# Patient Record
Sex: Female | Born: 1945 | Race: White | Hispanic: No | Marital: Married | Smoking: Current every day smoker
Health system: Southern US, Community
[De-identification: ages and names within clinical notes are randomized; demographics above are authoritative.]

## PROBLEM LIST (undated history)

## (undated) DIAGNOSIS — F419 Anxiety disorder, unspecified: Secondary | ICD-10-CM

## (undated) DIAGNOSIS — G2581 Restless legs syndrome: Secondary | ICD-10-CM

## (undated) DIAGNOSIS — R0600 Dyspnea, unspecified: Secondary | ICD-10-CM

## (undated) DIAGNOSIS — M1990 Unspecified osteoarthritis, unspecified site: Secondary | ICD-10-CM

## (undated) DIAGNOSIS — I10 Essential (primary) hypertension: Secondary | ICD-10-CM

## (undated) DIAGNOSIS — E119 Type 2 diabetes mellitus without complications: Secondary | ICD-10-CM

## (undated) DIAGNOSIS — E039 Hypothyroidism, unspecified: Secondary | ICD-10-CM

## (undated) DIAGNOSIS — J449 Chronic obstructive pulmonary disease, unspecified: Secondary | ICD-10-CM

## (undated) HISTORY — PX: BACK SURGERY: SHX140

## (undated) HISTORY — PX: HERNIA REPAIR: SHX51

## (undated) HISTORY — PX: ABDOMINAL HYSTERECTOMY: SHX81

## (undated) HISTORY — PX: CARPAL TUNNEL RELEASE: SHX101

---

## 2005-06-09 ENCOUNTER — Ambulatory Visit (HOSPITAL_COMMUNITY): Admission: RE | Admit: 2005-06-09 | Discharge: 2005-06-10 | Payer: Self-pay | Admitting: Neurological Surgery

## 2018-07-28 ENCOUNTER — Other Ambulatory Visit: Payer: Self-pay | Admitting: Neurological Surgery

## 2018-07-28 DIAGNOSIS — M4316 Spondylolisthesis, lumbar region: Secondary | ICD-10-CM

## 2018-08-10 ENCOUNTER — Ambulatory Visit
Admission: RE | Admit: 2018-08-10 | Discharge: 2018-08-10 | Disposition: A | Discharge status: Home / Self Care | Payer: Medicare HMO | Source: Ambulatory Visit | Attending: Neurological Surgery | Admitting: Neurological Surgery

## 2018-08-10 DIAGNOSIS — M4316 Spondylolisthesis, lumbar region: Secondary | ICD-10-CM

## 2018-08-10 MED ORDER — GADOBENATE DIMEGLUMINE 529 MG/ML IV SOLN
10.0000 mL | Freq: Once | INTRAVENOUS | Status: AC | PRN
  Administered 2018-08-10: 10 mL via INTRAVENOUS

## 2018-08-24 ENCOUNTER — Other Ambulatory Visit: Payer: Self-pay | Admitting: Student

## 2018-08-24 DIAGNOSIS — M4316 Spondylolisthesis, lumbar region: Secondary | ICD-10-CM

## 2018-09-02 ENCOUNTER — Ambulatory Visit
Admission: RE | Admit: 2018-09-02 | Discharge: 2018-09-02 | Disposition: A | Discharge status: Home / Self Care | Payer: Medicare HMO | Source: Ambulatory Visit | Attending: Student | Admitting: Student

## 2018-09-02 DIAGNOSIS — M4316 Spondylolisthesis, lumbar region: Secondary | ICD-10-CM

## 2018-09-02 MED ORDER — METHYLPREDNISOLONE ACETATE 40 MG/ML INJ SUSP (RADIOLOG
120.0000 mg | Freq: Once | INTRAMUSCULAR | Status: AC
  Administered 2018-09-02: 120 mg via EPIDURAL

## 2018-09-02 MED ORDER — IOPAMIDOL (ISOVUE-M 200) INJECTION 41%
1.0000 mL | Freq: Once | INTRAMUSCULAR | Status: AC
  Administered 2018-09-02: 1 mL via EPIDURAL

## 2018-09-02 NOTE — Discharge Instructions (Signed)

## 2018-09-20 ENCOUNTER — Other Ambulatory Visit: Payer: Self-pay | Admitting: Neurological Surgery

## 2018-11-23 ENCOUNTER — Other Ambulatory Visit (HOSPITAL_COMMUNITY): Payer: Medicare HMO

## 2018-11-24 ENCOUNTER — Ambulatory Visit (HOSPITAL_COMMUNITY)
Admission: RE | Admit: 2018-11-24 | Discharge: 2018-11-24 | Disposition: A | Discharge status: Home / Self Care | Payer: Medicare HMO | Source: Ambulatory Visit | Attending: Neurological Surgery | Admitting: Neurological Surgery

## 2018-11-24 ENCOUNTER — Encounter (HOSPITAL_COMMUNITY)
Admission: RE | Admit: 2018-11-24 | Discharge: 2018-11-24 | Disposition: A | Discharge status: Home / Self Care | Payer: Medicare HMO | Source: Ambulatory Visit | Attending: Neurological Surgery | Admitting: Neurological Surgery

## 2018-11-24 ENCOUNTER — Encounter (HOSPITAL_COMMUNITY): Payer: Self-pay

## 2018-11-24 DIAGNOSIS — M4310 Spondylolisthesis, site unspecified: Secondary | ICD-10-CM

## 2018-11-24 HISTORY — DX: Type 2 diabetes mellitus without complications: E11.9

## 2018-11-24 HISTORY — DX: Anxiety disorder, unspecified: F41.9

## 2018-11-24 HISTORY — DX: Hypothyroidism, unspecified: E03.9

## 2018-11-24 HISTORY — DX: Restless legs syndrome: G25.81

## 2018-11-24 HISTORY — DX: Unspecified osteoarthritis, unspecified site: M19.90

## 2018-11-24 HISTORY — DX: Chronic obstructive pulmonary disease, unspecified: J44.9

## 2018-11-24 HISTORY — DX: Essential (primary) hypertension: I10

## 2018-11-24 HISTORY — DX: Dyspnea, unspecified: R06.00

## 2018-11-24 LAB — TYPE AND SCREEN
ABO/RH(D): O NEG
Antibody Screen: NEGATIVE

## 2018-11-24 LAB — CBC WITH DIFFERENTIAL/PLATELET
Abs Immature Granulocytes: 0.06 10*3/uL (ref 0.00–0.07)
BASOS ABS: 0.1 10*3/uL (ref 0.0–0.1)
Basophils Relative: 1 %
Eosinophils Absolute: 0.2 10*3/uL (ref 0.0–0.5)
Eosinophils Relative: 2 %
HEMATOCRIT: 46.3 % — AB (ref 36.0–46.0)
Hemoglobin: 14.8 g/dL (ref 12.0–15.0)
Immature Granulocytes: 1 %
Lymphocytes Relative: 26 %
Lymphs Abs: 3 10*3/uL (ref 0.7–4.0)
MCH: 30.1 pg (ref 26.0–34.0)
MCHC: 32 g/dL (ref 30.0–36.0)
MCV: 94.3 fL (ref 80.0–100.0)
Monocytes Absolute: 1.1 10*3/uL — ABNORMAL HIGH (ref 0.1–1.0)
Monocytes Relative: 9 %
Neutro Abs: 7.2 10*3/uL (ref 1.7–7.7)
Neutrophils Relative %: 61 %
Platelets: 261 10*3/uL (ref 150–400)
RBC: 4.91 MIL/uL (ref 3.87–5.11)
RDW: 15.1 % (ref 11.5–15.5)
WBC: 11.6 10*3/uL — ABNORMAL HIGH (ref 4.0–10.5)
nRBC: 0 % (ref 0.0–0.2)

## 2018-11-24 LAB — BASIC METABOLIC PANEL
Anion gap: 12 (ref 5–15)
BUN: 22 mg/dL (ref 8–23)
CO2: 23 mmol/L (ref 22–32)
Calcium: 9.4 mg/dL (ref 8.9–10.3)
Chloride: 105 mmol/L (ref 98–111)
Creatinine, Ser: 1.28 mg/dL — ABNORMAL HIGH (ref 0.44–1.00)
GFR calc Af Amer: 49 mL/min — ABNORMAL LOW (ref >60)
GFR calc non Af Amer: 42 mL/min — ABNORMAL LOW (ref >60)
Glucose, Bld: 67 mg/dL — ABNORMAL LOW (ref 70–99)
Potassium: 4 mmol/L (ref 3.5–5.1)
Sodium: 140 mmol/L (ref 135–145)

## 2018-11-24 LAB — PROTIME-INR
INR: 0.93
Prothrombin Time: 12.3 seconds (ref 11.4–15.2)

## 2018-11-24 LAB — HEMOGLOBIN A1C
Hgb A1c MFr Bld: 7.2 % — ABNORMAL HIGH (ref 4.8–5.6)
Mean Plasma Glucose: 159.94 mg/dL

## 2018-11-24 LAB — GLUCOSE, CAPILLARY: Glucose-Capillary: 46 mg/dL — ABNORMAL LOW (ref 70–99)

## 2018-11-24 LAB — SURGICAL PCR SCREEN
MRSA, PCR: NEGATIVE
Staphylococcus aureus: NEGATIVE

## 2018-11-24 LAB — ABO/RH: ABO/RH(D): O NEG

## 2018-11-24 MED ORDER — CHLORHEXIDINE GLUCONATE CLOTH 2 % EX PADS: 6.0000 | MEDICATED_PAD | Freq: Once | CUTANEOUS | Status: DC

## 2018-11-24 NOTE — Progress Notes (Addendum)
Pt cbg was 46 at pre admission, discussed importance of controlled sugar.Pt was given oj and cookies. Pt stated she would call PCP to discuss cbg and clearance. Will f/u with Revonda Standard pa

## 2018-11-24 NOTE — Pre-Procedure Instructions (Signed)
Jill Friedman  11/24/2018      [facility] - [place], [place] - [address] [place] [address] Phone: [phone] Fax: [phone]    Your procedure is scheduled on 11/22/18.  Report to [facility] Admitting at 50 A.M.  Call this number if you have problems the morning of surgery:  [phone]   Remember:  Do not eat or drink after midnight.  Y Take these medicines the morning of surgery with A SIP OF WATER ---    Do not wear jewelry, make-up or nail polish.  Do not wear lotions, powders, or perfumes, or deodorant.  Do not shave 48 hours prior to surgery.  Men may shave face and neck.  Do not bring valuables to the hospital.  [facility] is not responsible for any belongings or valuables.  Contacts, dentures or bridgework may not be worn into surgery.  Leave your suitcase in the car.  After surgery it may be brought to your room.  For patients admitted to the hospital, discharge time will be determined by your treatment team.  Patients discharged the day of surgery will not be allowed to drive home.   Name and phone number of your driver:   Do not take any aspirin,anti-inflammatories,vitamins,or herbal supplements 5-7 days prior to surgery. Special instructions:   - Preparing for Surgery  Before surgery, you can play an important role.  Because skin is not sterile, your skin needs to be as free of germs as possible.  You can reduce the number of germs on you skin by washing with CHG (chlorahexidine gluconate) soap before surgery.  CHG is an antiseptic cleaner which kills germs and bonds with the skin to continue killing germs even after washing.  Oral Hygiene is also important in reducing the risk of infection.  Remember to brush your teeth with your regular toothpaste the morning of surgery.  Please DO NOT use if you have an allergy to CHG or antibacterial soaps.  If your skin becomes reddened/irritated stop using  the CHG and inform your nurse when you arrive at Short Stay.  Do not shave (including legs and underarms) for at least 48 hours prior to the first CHG shower.  You may shave your face.  Please follow these instructions carefully:   1.  Shower with CHG Soap the night before surgery and the morning of Surgery.  2.  If you choose to wash your hair, wash your hair first as usual with your normal shampoo.  3.  After you shampoo, rinse your hair and body thoroughly to remove the shampoo. 4.  Use CHG as you would any other liquid soap.  You can apply chg directly to the skin and wash gently with a      scrungie or washcloth.           5.  Apply the CHG Soap to your body ONLY FROM THE NECK DOWN.   Do not use on open wounds or open sores. Avoid contact with your eyes, ears, mouth and genitals (private parts).  Wash genitals (private parts) with your normal soap.  6.  Wash thoroughly, paying special attention to the area where your surgery will be performed.  7.  Thoroughly rinse your body with warm water from the neck down.  8.  DO NOT shower/wash with your normal soap after using and rinsing off the CHG Soap.  9.  Pat yourself dry with a clean  towel.            10.  Wear clean pajamas.            11.  Place clean sheets on your bed the night of your first shower and do not sleep with pets.  Day of Surgery  Do not apply any lotions/deoderants the morning of surgery.   Please wear clean clothes to the hospital/surgery center. Remember to brush your teeth with toothpaste.    Please read over the following fact sheets that you were given. MRSA Information

## 2018-11-25 ENCOUNTER — Encounter (HOSPITAL_COMMUNITY): Payer: Self-pay

## 2018-11-25 NOTE — Progress Notes (Addendum)
Anesthesia Chart Review:  Case:  [id number] Date/Time:  12/02/18 0715   Procedure:  TLIF - right - L4-L5 - Interbody Fusion (Right Back)   Anesthesia type:  General   Pre-op diagnosis:  Spondylolisthesis   Location:  [facility] OR ROOM 19 / [facility] OR   Surgeon:  [name], MD      DISCUSSION: Patient is a 73 year old Jill Friedman scheduled for the above procedure.  History includes smoking, HTN, DM2, COPD, hypothyroidism, dyspnea, inflammatory arthritis.  - May 2019 EKG showed sinus arrhythmia, PACs. Done for evaluation of "irregular" heart rhythm by [facility] exam. I called and spoke with patient on 11/25/18. She denied chest pain, SOB, edema, orthopnea, palpitations. She said she can clean her house, although husband typically vacuums, but overall activity is more limited by her back and not by breathing or any cardiac symptoms. Patient said a few years ago a provider thought she may have a murmur, but no testing was ordered and since then has not been told she had a murmur. (No murmur documented at her most recent primary care and rheumatology visits.) She denied history of prior cardiac testing other than EKGs. Her mother died in her 51's for CHF (possible related to HTN). No family history of sudden cardiac death.   - She was hypoglycemic at PAT--reports she had not eaten as much given her appointment. She checks her fasting CBG daily with range of 97-143 over the past several days.  - She does smoke 1PP. Treated out-patient for bronchitis earlier in 2019. No home O2. Has nebulizer PRN, but without frequent need.  Awaiting EKG tracings from Dr. Adela Ports office but based on their report narrative and above known information, it is anticipated that she can proceed as planned if no acute changes. Chart also discussed with anesthesiologist Karna Christmas, MD. (UPDATE 11/20/18 10:40 AM: 04/15/18 EKG tracing x2 received from Dr. Adela Ports office. Report summary as already outlined showing right BBB. There were no  significant T wave changes. No new recommendations since my previous discussion with Dr. Bradley Ferris.)   VS: BP (!) 143/88   Pulse 89   Temp 37.1 C   Ht 5\' 2"  (1.575 m)   Wt 54.5 kg   SpO2 94%   BMI 21.98 kg/m    PROVIDERS: Tarri Fuller, MD is PCP [facility] Everywhere) Francee Gentile, MD is rheumatologist [facility] Everywhere).   LABS: Preoperative labs noted. Cr 1.28. Glucose 67 (following OJ, cooke for CBG 46). A1c 7.2.  (all labs ordered are listed, but only abnormal results are displayed)  Labs Reviewed  GLUCOSE, CAPILLARY - Abnormal; Notable for the following components:      Result Value   Glucose-Capillary 46 (*)    All other components within normal limits  BASIC METABOLIC PANEL - Abnormal; Notable for the following components:   Glucose, Bld 67 (*)    Creatinine, Ser 1.28 (*)    GFR calc non Af Amer 42 (*)    GFR calc Af Amer 49 (*)    All other components within normal limits  CBC WITH DIFFERENTIAL/PLATELET - Abnormal; Notable for the following components:   WBC 11.6 (*)    HCT 46.3 (*)    Monocytes Absolute 1.1 (*)    All other components within normal limits  HEMOGLOBIN A1C - Abnormal; Notable for the following components:   Hgb A1c MFr Bld 7.2 (*)    All other components within normal limits  SURGICAL PCR SCREEN  PROTIME-INR  TYPE AND SCREEN  ABO/RH    IMAGES: CXR 11/24/18: IMPRESSION: No active cardiopulmonary disease.   EKG: Patient seen by Anice Paganinipcp Escajeda, Richard, MD on 04/15/18 [facility] was irregular on exam. Patient denied chest pain, palpitation, dizziness, dyspnea, fatigue. HR was 51 bpm, BP 140/70. EKG showed underlying SR with PACs and sinus arrhythmia. Verapamil was decreased due to bradycardia. TSH WNL. K was 5.6. Routine follow-up recommended. - 04/15/18 (PCP office): SR with marked sinus arrhythmia. Right BBB. When compared with ECG of 15-Apr-2018 10:37, Premature atrial complexes are no longer present Confirmed by  [facility], DR. F.R. ([id number]) on 04/15/2018 2:27:36 PM   - 04/15/18 (PCP office): Sinus bradycardia with Premature atrial complexes. Right bundle branch block. No previous ECGs available.Confirmed by fellow Hewitt ShortsBerdy, Andrew ([id number]) on 04/15/2018 1:11:18 PM Confirmed by Claris GowerKAHL, DR. F.R. ([id number]) on 04/15/2018 2:27:30 PM     CV: N/A  Past Medical History:  Diagnosis Date  . Anxiety   . Arthritis   . COPD (chronic obstructive pulmonary disease) (HCC)   . Diabetes mellitus without complication (HCC)   . Dyspnea   . Hypertension   . Hypothyroidism   . RLS (restless legs syndrome)     Past Surgical History:  Procedure Laterality Date  . ABDOMINAL HYSTERECTOMY    . BACK SURGERY    . CARPAL TUNNEL RELEASE    . HERNIA REPAIR      MEDICATIONS: . Cholecalciferol (VITAMIN D-1000 MAX ST) 25 MCG (1000 UT) tablet  . folic acid (FOLVITE) 1 MG tablet  . gabapentin (NEURONTIN) 600 MG tablet  . glimepiride (AMARYL) 4 MG tablet  . levothyroxine (SYNTHROID, LEVOTHROID) 75 MCG tablet  . lisinopril (PRINIVIL,ZESTRIL) 5 MG tablet  . metFORMIN (GLUCOPHAGE) 1000 MG tablet  . Multiple Vitamins-Minerals (CENTRUM SILVER 50+WOMEN) TABS  . verapamil (CALAN) 80 MG tablet  . vitamin B-12 (CYANOCOBALAMIN) 1000 MCG tablet   No current facility-administered medications for this encounter.     Shonna ChockAllison Zofia Peckinpaugh, PA-C Surgical Short Stay/Anesthesiology [facility] Phone [phone] [facility] Phone [phone] 11/26/2018 1:24 PM

## 2018-11-26 NOTE — Anesthesia Preprocedure Evaluation (Addendum)
Anesthesia Evaluation  Patient identified by MRN, date of birth, ID band Patient awake    Reviewed: Allergy & Precautions, NPO status , Patient's Chart, lab work & pertinent test results  Airway Mallampati: II  TM Distance: >3 FB Neck ROM: Full    Dental  (+) Dental Advisory Given, Edentulous Upper   Pulmonary COPD,  COPD inhaler, Current Smoker,    breath sounds clear to auscultation       Cardiovascular hypertension, Pt. on medications  Rhythm:Regular Rate:Normal     Neuro/Psych    GI/Hepatic   Endo/Other  diabetes, Type 2  Renal/GU      Musculoskeletal   Abdominal   Peds  Hematology   Anesthesia Other Findings   Reproductive/Obstetrics                           Anesthesia Physical Anesthesia Plan  ASA: III  Anesthesia Plan: General   Post-op Pain Management:    Induction: Intravenous  PONV Risk Score and Plan: Ondansetron  Airway Management Planned: Oral ETT  Additional Equipment:   Intra-op Plan:   Post-operative Plan: Extubation in OR  Informed Consent: I have reviewed the patients History and Physical, chart, labs and discussed the procedure including the risks, benefits and alternatives for the proposed anesthesia with the patient or authorized representative who has indicated his/her understanding and acceptance.     Dental advisory given  Plan Discussed with: CRNA and Anesthesiologist  Anesthesia Plan Comments: (PAT note written 11/26/2018 by Shonna Chock, PA-C. )       Anesthesia Quick Evaluation

## 2018-12-02 ENCOUNTER — Encounter (HOSPITAL_COMMUNITY): Payer: Self-pay | Admitting: *Deleted

## 2018-12-02 ENCOUNTER — Inpatient Hospital Stay (HOSPITAL_COMMUNITY): Payer: Medicare HMO | Admitting: Vascular Surgery

## 2018-12-02 ENCOUNTER — Encounter (HOSPITAL_COMMUNITY)
Admission: RE | Disposition: A | Discharge status: Home / Self Care | Payer: Self-pay | Source: Home / Self Care | Attending: Neurological Surgery

## 2018-12-02 ENCOUNTER — Inpatient Hospital Stay (HOSPITAL_COMMUNITY): Payer: Medicare HMO | Admitting: Anesthesiology

## 2018-12-02 ENCOUNTER — Inpatient Hospital Stay (HOSPITAL_COMMUNITY)
Admission: RE | Admit: 2018-12-02 | Discharge: 2018-12-03 | DRG: 455 | Disposition: A | Discharge status: Home / Self Care | Payer: Medicare HMO | Attending: Neurological Surgery | Admitting: Neurological Surgery

## 2018-12-02 DIAGNOSIS — Z79899 Other long term (current) drug therapy: Secondary | ICD-10-CM | POA: Diagnosis not present

## 2018-12-02 DIAGNOSIS — Z90710 Acquired absence of both cervix and uterus: Secondary | ICD-10-CM

## 2018-12-02 DIAGNOSIS — Z79890 Hormone replacement therapy (postmenopausal): Secondary | ICD-10-CM

## 2018-12-02 DIAGNOSIS — I10 Essential (primary) hypertension: Secondary | ICD-10-CM | POA: Diagnosis present

## 2018-12-02 DIAGNOSIS — M48061 Spinal stenosis, lumbar region without neurogenic claudication: Principal | ICD-10-CM | POA: Diagnosis present

## 2018-12-02 DIAGNOSIS — J449 Chronic obstructive pulmonary disease, unspecified: Secondary | ICD-10-CM | POA: Diagnosis not present

## 2018-12-02 DIAGNOSIS — F17210 Nicotine dependence, cigarettes, uncomplicated: Secondary | ICD-10-CM | POA: Diagnosis present

## 2018-12-02 DIAGNOSIS — M4316 Spondylolisthesis, lumbar region: Secondary | ICD-10-CM | POA: Diagnosis present

## 2018-12-02 DIAGNOSIS — Z981 Arthrodesis status: Secondary | ICD-10-CM

## 2018-12-02 DIAGNOSIS — E119 Type 2 diabetes mellitus without complications: Secondary | ICD-10-CM | POA: Diagnosis not present

## 2018-12-02 DIAGNOSIS — G2581 Restless legs syndrome: Secondary | ICD-10-CM | POA: Diagnosis present

## 2018-12-02 DIAGNOSIS — E039 Hypothyroidism, unspecified: Secondary | ICD-10-CM | POA: Diagnosis not present

## 2018-12-02 DIAGNOSIS — Z419 Encounter for procedure for purposes other than remedying health state, unspecified: Secondary | ICD-10-CM

## 2018-12-02 DIAGNOSIS — Z7984 Long term (current) use of oral hypoglycemic drugs: Secondary | ICD-10-CM

## 2018-12-02 HISTORY — PX: MAXIMUM ACCESS (MAS) TRANSFORAMINAL LUMBAR INTERBODY FUSION (TLIF) 1 LEVEL: SHX6392

## 2018-12-02 LAB — GLUCOSE, CAPILLARY
Glucose-Capillary: 154 mg/dL — ABNORMAL HIGH (ref 70–99)
Glucose-Capillary: 138 mg/dL — ABNORMAL HIGH (ref 70–99)
Glucose-Capillary: 261 mg/dL — ABNORMAL HIGH (ref 70–99)
Glucose-Capillary: 198 mg/dL — ABNORMAL HIGH (ref 70–99)
Glucose-Capillary: 190 mg/dL — ABNORMAL HIGH (ref 70–99)

## 2018-12-02 SURGERY — MAXIMUM ACCESS (MAS) TRANSFORAMINAL LUMBAR INTERBODY FUSION (TLIF) 1 LEVEL
Anesthesia: General | Site: Back | Laterality: Right

## 2018-12-02 MED ORDER — SODIUM CHLORIDE 0.9% FLUSH
3.0000 mL | Freq: Two times a day (BID) | INTRAVENOUS | Status: DC
  Administered 2018-12-02: 3 mL via INTRAVENOUS

## 2018-12-02 MED ORDER — INSULIN ASPART 100 UNIT/ML ~~LOC~~ SOLN
0.0000 [IU] | Freq: Three times a day (TID) | SUBCUTANEOUS | Status: DC
  Administered 2018-12-02: 8 [IU] via SUBCUTANEOUS
  Administered 2018-12-02: 3 [IU] via SUBCUTANEOUS

## 2018-12-02 MED ORDER — ACETAMINOPHEN 325 MG PO TABS: 650.0000 mg | ORAL_TABLET | ORAL | Status: DC | PRN

## 2018-12-02 MED ORDER — FOLIC ACID 1 MG PO TABS
1.0000 mg | ORAL_TABLET | Freq: Every day | ORAL | Status: DC
  Filled 2018-12-02: qty 1

## 2018-12-02 MED ORDER — LEVOTHYROXINE SODIUM 75 MCG PO TABS
75.0000 ug | ORAL_TABLET | Freq: Every day | ORAL | Status: DC
  Administered 2018-12-03: 75 ug via ORAL
  Filled 2018-12-02 (×2): qty 1

## 2018-12-02 MED ORDER — MIDAZOLAM HCL 5 MG/5ML IJ SOLN
INTRAMUSCULAR | Status: DC | PRN
  Administered 2018-12-02: 2 mg via INTRAVENOUS

## 2018-12-02 MED ORDER — ROCURONIUM BROMIDE 50 MG/5ML IV SOSY
PREFILLED_SYRINGE | INTRAVENOUS | Status: AC
  Filled 2018-12-02: qty 5

## 2018-12-02 MED ORDER — THROMBIN 5000 UNITS EX SOLR
CUTANEOUS | Status: AC
  Filled 2018-12-02: qty 5000

## 2018-12-02 MED ORDER — 0.9 % SODIUM CHLORIDE (POUR BTL) OPTIME
TOPICAL | Status: DC | PRN
  Administered 2018-12-02: 1000 mL

## 2018-12-02 MED ORDER — HYDROMORPHONE HCL 2 MG PO TABS
2.0000 mg | ORAL_TABLET | ORAL | Status: DC | PRN
  Administered 2018-12-02 – 2018-12-03 (×3): 2 mg via ORAL
  Filled 2018-12-02 (×3): qty 1

## 2018-12-02 MED ORDER — PHENYLEPHRINE 40 MCG/ML (10ML) SYRINGE FOR IV PUSH (FOR BLOOD PRESSURE SUPPORT)
PREFILLED_SYRINGE | INTRAVENOUS | Status: DC | PRN
  Administered 2018-12-02 (×2): 40 ug via INTRAVENOUS

## 2018-12-02 MED ORDER — FENTANYL CITRATE (PF) 100 MCG/2ML IJ SOLN
25.0000 ug | INTRAMUSCULAR | Status: DC | PRN
  Administered 2018-12-02 (×4): 25 ug via INTRAVENOUS

## 2018-12-02 MED ORDER — CEFAZOLIN SODIUM-DEXTROSE 2-4 GM/100ML-% IV SOLN
2.0000 g | Freq: Three times a day (TID) | INTRAVENOUS | Status: AC
  Administered 2018-12-02 (×2): 2 g via INTRAVENOUS
  Filled 2018-12-02 (×2): qty 100

## 2018-12-02 MED ORDER — SODIUM CHLORIDE 0.9% FLUSH: 3.0000 mL | INTRAVENOUS | Status: DC | PRN

## 2018-12-02 MED ORDER — LIDOCAINE 2% (20 MG/ML) 5 ML SYRINGE
INTRAMUSCULAR | Status: AC
  Filled 2018-12-02: qty 5

## 2018-12-02 MED ORDER — HEPARIN SODIUM (PORCINE) 1000 UNIT/ML IJ SOLN
INTRAMUSCULAR | Status: AC
  Filled 2018-12-02: qty 1

## 2018-12-02 MED ORDER — CEFAZOLIN SODIUM-DEXTROSE 2-4 GM/100ML-% IV SOLN
2.0000 g | INTRAVENOUS | Status: AC
  Administered 2018-12-02: 2 g via INTRAVENOUS
  Filled 2018-12-02: qty 100

## 2018-12-02 MED ORDER — THROMBIN 20000 UNITS EX SOLR
CUTANEOUS | Status: DC | PRN
  Administered 2018-12-02: 20 mL via TOPICAL

## 2018-12-02 MED ORDER — MORPHINE SULFATE (PF) 2 MG/ML IV SOLN
2.0000 mg | INTRAVENOUS | Status: DC | PRN
  Administered 2018-12-02: 2 mg via INTRAVENOUS
  Filled 2018-12-02: qty 1

## 2018-12-02 MED ORDER — ONDANSETRON HCL 4 MG/2ML IJ SOLN
4.0000 mg | Freq: Four times a day (QID) | INTRAMUSCULAR | Status: DC | PRN
  Administered 2018-12-02: 4 mg via INTRAVENOUS
  Filled 2018-12-02: qty 2

## 2018-12-02 MED ORDER — THROMBIN 5000 UNITS EX SOLR
OROMUCOSAL | Status: DC | PRN
  Administered 2018-12-02: 5 mL via TOPICAL

## 2018-12-02 MED ORDER — VANCOMYCIN HCL 1000 MG IV SOLR
INTRAVENOUS | Status: AC
  Filled 2018-12-02: qty 1000

## 2018-12-02 MED ORDER — HYDROCODONE-ACETAMINOPHEN 7.5-325 MG PO TABS
2.0000 | ORAL_TABLET | ORAL | Status: DC | PRN
  Administered 2018-12-02: 2 via ORAL
  Filled 2018-12-02: qty 2

## 2018-12-02 MED ORDER — LISINOPRIL 10 MG PO TABS
5.0000 mg | ORAL_TABLET | Freq: Two times a day (BID) | ORAL | Status: DC
  Administered 2018-12-02 – 2018-12-03 (×3): 5 mg via ORAL
  Filled 2018-12-02 (×3): qty 1

## 2018-12-02 MED ORDER — DEXAMETHASONE SODIUM PHOSPHATE 10 MG/ML IJ SOLN
10.0000 mg | INTRAMUSCULAR | Status: AC
  Administered 2018-12-02: 10 mg via INTRAVENOUS
  Filled 2018-12-02: qty 1

## 2018-12-02 MED ORDER — INSULIN ASPART 100 UNIT/ML ~~LOC~~ SOLN: 0.0000 [IU] | Freq: Every day | SUBCUTANEOUS | Status: DC

## 2018-12-02 MED ORDER — GLIMEPIRIDE 2 MG PO TABS
8.0000 mg | ORAL_TABLET | Freq: Every day | ORAL | Status: DC
  Administered 2018-12-03: 8 mg via ORAL
  Filled 2018-12-02: qty 4

## 2018-12-02 MED ORDER — ONDANSETRON HCL 4 MG/2ML IJ SOLN: 4.0000 mg | Freq: Once | INTRAMUSCULAR | Status: DC | PRN

## 2018-12-02 MED ORDER — FENTANYL CITRATE (PF) 250 MCG/5ML IJ SOLN
INTRAMUSCULAR | Status: AC
  Filled 2018-12-02: qty 5

## 2018-12-02 MED ORDER — METHOCARBAMOL 500 MG PO TABS
500.0000 mg | ORAL_TABLET | Freq: Four times a day (QID) | ORAL | Status: DC | PRN
  Administered 2018-12-02 – 2018-12-03 (×3): 500 mg via ORAL
  Filled 2018-12-02 (×3): qty 1

## 2018-12-02 MED ORDER — GABAPENTIN 600 MG PO TABS
600.0000 mg | ORAL_TABLET | Freq: Two times a day (BID) | ORAL | Status: DC
  Administered 2018-12-02 – 2018-12-03 (×2): 600 mg via ORAL
  Filled 2018-12-02 (×3): qty 1

## 2018-12-02 MED ORDER — VANCOMYCIN HCL 1000 MG IV SOLR
INTRAVENOUS | Status: DC | PRN
  Administered 2018-12-02: 1000 mg

## 2018-12-02 MED ORDER — HYDROXYZINE HCL 50 MG/ML IM SOLN
50.0000 mg | Freq: Four times a day (QID) | INTRAMUSCULAR | Status: DC | PRN
  Administered 2018-12-02: 50 mg via INTRAMUSCULAR
  Filled 2018-12-02: qty 1

## 2018-12-02 MED ORDER — SODIUM CHLORIDE 0.9 % IV SOLN: 250.0000 mL | INTRAVENOUS | Status: DC

## 2018-12-02 MED ORDER — ONDANSETRON HCL 4 MG/2ML IJ SOLN
INTRAMUSCULAR | Status: AC
  Filled 2018-12-02: qty 2

## 2018-12-02 MED ORDER — PHENOL 1.4 % MT LIQD: 1.0000 | OROMUCOSAL | Status: DC | PRN

## 2018-12-02 MED ORDER — MENTHOL 3 MG MT LOZG: 1.0000 | LOZENGE | OROMUCOSAL | Status: DC | PRN

## 2018-12-02 MED ORDER — VITAMIN D 25 MCG (1000 UNIT) PO TABS
1000.0000 [IU] | ORAL_TABLET | Freq: Every day | ORAL | Status: DC
  Administered 2018-12-02: 1000 [IU] via ORAL
  Filled 2018-12-02: qty 1

## 2018-12-02 MED ORDER — SENNA 8.6 MG PO TABS
1.0000 | ORAL_TABLET | Freq: Two times a day (BID) | ORAL | Status: DC
  Administered 2018-12-02 (×2): 8.6 mg via ORAL
  Filled 2018-12-02 (×2): qty 1

## 2018-12-02 MED ORDER — VERAPAMIL HCL 80 MG PO TABS
80.0000 mg | ORAL_TABLET | Freq: Two times a day (BID) | ORAL | Status: DC
  Administered 2018-12-02 – 2018-12-03 (×2): 80 mg via ORAL
  Filled 2018-12-02 (×3): qty 1

## 2018-12-02 MED ORDER — ROCURONIUM BROMIDE 10 MG/ML (PF) SYRINGE
PREFILLED_SYRINGE | INTRAVENOUS | Status: DC | PRN
  Administered 2018-12-02: 50 mg via INTRAVENOUS
  Administered 2018-12-02: 10 mg via INTRAVENOUS

## 2018-12-02 MED ORDER — FENTANYL CITRATE (PF) 100 MCG/2ML IJ SOLN
INTRAMUSCULAR | Status: DC | PRN
  Administered 2018-12-02: 100 ug via INTRAVENOUS
  Administered 2018-12-02: 50 ug via INTRAVENOUS

## 2018-12-02 MED ORDER — LACTATED RINGERS IV SOLN
INTRAVENOUS | Status: DC | PRN
  Administered 2018-12-02 (×2): via INTRAVENOUS

## 2018-12-02 MED ORDER — ADULT MULTIVITAMIN W/MINERALS CH
1.0000 | ORAL_TABLET | Freq: Every day | ORAL | Status: DC
  Administered 2018-12-02: 1 via ORAL
  Filled 2018-12-02: qty 1

## 2018-12-02 MED ORDER — METFORMIN HCL 500 MG PO TABS
1000.0000 mg | ORAL_TABLET | Freq: Two times a day (BID) | ORAL | Status: DC
  Administered 2018-12-02 – 2018-12-03 (×2): 1000 mg via ORAL
  Filled 2018-12-02 (×2): qty 2

## 2018-12-02 MED ORDER — SODIUM CHLORIDE 0.9 % IV SOLN
INTRAVENOUS | Status: DC | PRN
  Administered 2018-12-02: 500 mL

## 2018-12-02 MED ORDER — ONDANSETRON HCL 4 MG/2ML IJ SOLN
INTRAMUSCULAR | Status: DC | PRN
  Administered 2018-12-02: 4 mg via INTRAVENOUS

## 2018-12-02 MED ORDER — BUPIVACAINE HCL (PF) 0.25 % IJ SOLN
INTRAMUSCULAR | Status: DC | PRN
  Administered 2018-12-02: 4 mL

## 2018-12-02 MED ORDER — PROPOFOL 10 MG/ML IV BOLUS
INTRAVENOUS | Status: AC
  Filled 2018-12-02: qty 20

## 2018-12-02 MED ORDER — THROMBIN 20000 UNITS EX SOLR
CUTANEOUS | Status: AC
  Filled 2018-12-02: qty 20000

## 2018-12-02 MED ORDER — LIDOCAINE 2% (20 MG/ML) 5 ML SYRINGE
INTRAMUSCULAR | Status: DC | PRN
  Administered 2018-12-02: 40 mg via INTRAVENOUS

## 2018-12-02 MED ORDER — SUGAMMADEX SODIUM 200 MG/2ML IV SOLN
INTRAVENOUS | Status: DC | PRN
  Administered 2018-12-02: 125 mg via INTRAVENOUS

## 2018-12-02 MED ORDER — ACETAMINOPHEN 10 MG/ML IV SOLN
INTRAVENOUS | Status: AC
  Filled 2018-12-02: qty 100

## 2018-12-02 MED ORDER — INSULIN ASPART 100 UNIT/ML ~~LOC~~ SOLN
0.0000 [IU] | Freq: Three times a day (TID) | SUBCUTANEOUS | Status: DC
  Administered 2018-12-03: 2 [IU] via SUBCUTANEOUS

## 2018-12-02 MED ORDER — MIDAZOLAM HCL 2 MG/2ML IJ SOLN
INTRAMUSCULAR | Status: AC
  Filled 2018-12-02: qty 2

## 2018-12-02 MED ORDER — CELECOXIB 200 MG PO CAPS
200.0000 mg | ORAL_CAPSULE | Freq: Two times a day (BID) | ORAL | Status: DC
  Administered 2018-12-02 – 2018-12-03 (×3): 200 mg via ORAL
  Filled 2018-12-02 (×3): qty 1

## 2018-12-02 MED ORDER — PHENYLEPHRINE 40 MCG/ML (10ML) SYRINGE FOR IV PUSH (FOR BLOOD PRESSURE SUPPORT)
PREFILLED_SYRINGE | INTRAVENOUS | Status: AC
  Filled 2018-12-02: qty 10

## 2018-12-02 MED ORDER — METHOCARBAMOL 1000 MG/10ML IJ SOLN
500.0000 mg | Freq: Four times a day (QID) | INTRAVENOUS | Status: DC | PRN
  Filled 2018-12-02: qty 5

## 2018-12-02 MED ORDER — BUPIVACAINE HCL (PF) 0.25 % IJ SOLN
INTRAMUSCULAR | Status: AC
  Filled 2018-12-02: qty 30

## 2018-12-02 MED ORDER — ONDANSETRON HCL 4 MG PO TABS: 4.0000 mg | ORAL_TABLET | Freq: Four times a day (QID) | ORAL | Status: DC | PRN

## 2018-12-02 MED ORDER — POTASSIUM CHLORIDE IN NACL 20-0.9 MEQ/L-% IV SOLN: INTRAVENOUS | Status: DC

## 2018-12-02 MED ORDER — FENTANYL CITRATE (PF) 100 MCG/2ML IJ SOLN
INTRAMUSCULAR | Status: AC
  Filled 2018-12-02: qty 2

## 2018-12-02 MED ORDER — ACETAMINOPHEN 650 MG RE SUPP: 650.0000 mg | RECTAL | Status: DC | PRN

## 2018-12-02 MED ORDER — PROPOFOL 10 MG/ML IV BOLUS
INTRAVENOUS | Status: DC | PRN
  Administered 2018-12-02: 120 mg via INTRAVENOUS
  Administered 2018-12-02: 20 mg via INTRAVENOUS

## 2018-12-02 MED ORDER — ROCURONIUM BROMIDE 50 MG/5ML IV SOSY
PREFILLED_SYRINGE | INTRAVENOUS | Status: AC
  Filled 2018-12-02: qty 10

## 2018-12-02 MED ORDER — ACETAMINOPHEN 10 MG/ML IV SOLN
1000.0000 mg | Freq: Once | INTRAVENOUS | Status: AC
  Administered 2018-12-02: 1000 mg via INTRAVENOUS

## 2018-12-02 SURGICAL SUPPLY — 69 items
BAG DECANTER FOR FLEXI CONT (MISCELLANEOUS) ×2 IMPLANT
BASKET BONE COLLECTION (BASKET) ×2 IMPLANT
BENZOIN TINCTURE PRP APPL 2/3 (GAUZE/BANDAGES/DRESSINGS) ×2 IMPLANT
BLADE CLIPPER SURG (BLADE) IMPLANT
BONE VIVIGEN FORMABLE 1.3CC (Bone Implant) ×4 IMPLANT
BUR MATCHSTICK NEURO 3.0 LAGG (BURR) ×2 IMPLANT
CANISTER SUCT 3000ML PPV (MISCELLANEOUS) ×2 IMPLANT
CARTRIDGE OIL MAESTRO DRILL (MISCELLANEOUS) ×1 IMPLANT
CLSR STERI-STRIP ANTIMIC 1/2X4 (GAUZE/BANDAGES/DRESSINGS) ×1 IMPLANT
CONT SPEC 4OZ CLIKSEAL STRL BL (MISCELLANEOUS) ×2 IMPLANT
COVER BACK TABLE 24X17X13 BIG (DRAPES) IMPLANT
COVER BACK TABLE 60X90IN (DRAPES) ×2 IMPLANT
COVER WAND RF STERILE (DRAPES) ×2 IMPLANT
DERMABOND ADVANCED (GAUZE/BANDAGES/DRESSINGS) ×1
DERMABOND ADVANCED .7 DNX12 (GAUZE/BANDAGES/DRESSINGS) IMPLANT
DIFFUSER DRILL AIR PNEUMATIC (MISCELLANEOUS) ×2 IMPLANT
DRAPE C-ARM 42X72 X-RAY (DRAPES) ×2 IMPLANT
DRAPE C-ARMOR (DRAPES) ×2 IMPLANT
DRAPE LAPAROTOMY 100X72X124 (DRAPES) ×2 IMPLANT
DRAPE POUCH INSTRU U-SHP 10X18 (DRAPES) ×2 IMPLANT
DRAPE SURG 17X23 STRL (DRAPES) ×2 IMPLANT
DRSG OPSITE POSTOP 4X6 (GAUZE/BANDAGES/DRESSINGS) ×1 IMPLANT
DURAPREP 26ML APPLICATOR (WOUND CARE) ×2 IMPLANT
ELECT REM PT RETURN 9FT ADLT (ELECTROSURGICAL) ×2
ELECTRODE REM PT RTRN 9FT ADLT (ELECTROSURGICAL) ×1 IMPLANT
EVACUATOR 1/8 PVC DRAIN (DRAIN) ×2 IMPLANT
GAUZE 4X4 16PLY RFD (DISPOSABLE) IMPLANT
GLOVE BIO SURGEON STRL SZ7 (GLOVE) ×1 IMPLANT
GLOVE BIO SURGEON STRL SZ8 (GLOVE) ×4 IMPLANT
GLOVE BIOGEL PI IND STRL 7.0 (GLOVE) IMPLANT
GLOVE BIOGEL PI IND STRL 7.5 (GLOVE) IMPLANT
GLOVE BIOGEL PI INDICATOR 7.0 (GLOVE) ×3
GLOVE BIOGEL PI INDICATOR 7.5 (GLOVE) ×2
GLOVE SURG SS PI 7.0 STRL IVOR (GLOVE) ×4 IMPLANT
GOWN STRL REUS W/ TWL LRG LVL3 (GOWN DISPOSABLE) IMPLANT
GOWN STRL REUS W/ TWL XL LVL3 (GOWN DISPOSABLE) ×2 IMPLANT
GOWN STRL REUS W/TWL 2XL LVL3 (GOWN DISPOSABLE) IMPLANT
GOWN STRL REUS W/TWL LRG LVL3 (GOWN DISPOSABLE) ×3
GOWN STRL REUS W/TWL XL LVL3 (GOWN DISPOSABLE) ×1
GRAFT BNE MATRIX VG FRMBL SM 1 (Bone Implant) IMPLANT
HEMOSTAT POWDER KIT SURGIFOAM (HEMOSTASIS) ×1 IMPLANT
KIT BASIN OR (CUSTOM PROCEDURE TRAY) ×2 IMPLANT
KIT TURNOVER KIT B (KITS) ×2 IMPLANT
NDL HYPO 25X1 1.5 SAFETY (NEEDLE) ×1 IMPLANT
NEEDLE HYPO 25X1 1.5 SAFETY (NEEDLE) ×2 IMPLANT
NS IRRIG 1000ML POUR BTL (IV SOLUTION) ×2 IMPLANT
OIL CARTRIDGE MAESTRO DRILL (MISCELLANEOUS) ×2
PACK LAMINECTOMY NEURO (CUSTOM PROCEDURE TRAY) ×2 IMPLANT
PAD ARMBOARD 7.5X6 YLW CONV (MISCELLANEOUS) ×6 IMPLANT
PUTTY DBM 10CC ×1 IMPLANT
ROD LORD TI 5.5X35 (Rod) ×1 IMPLANT
ROD LORDOTIC TI 5.5X40 KODIAK (Rod) ×1 IMPLANT
SCREW PA INVICTUS 5.5X35 (Screw) ×4 IMPLANT
SET SCREW (Screw) ×4 IMPLANT
SET SCREW SPNE (Screw) IMPLANT
SPACER IDENTITI 10D 11X10X25 (Spacer) ×1 IMPLANT
SPONGE LAP 4X18 RFD (DISPOSABLE) IMPLANT
SPONGE SURGIFOAM ABS GEL 100 (HEMOSTASIS) ×2 IMPLANT
STRIP CLOSURE SKIN 1/2X4 (GAUZE/BANDAGES/DRESSINGS) ×4 IMPLANT
SUT VIC AB 0 CT1 18XCR BRD8 (SUTURE) ×1 IMPLANT
SUT VIC AB 0 CT1 8-18 (SUTURE) ×2
SUT VIC AB 2-0 CP2 18 (SUTURE) ×1 IMPLANT
SUT VIC AB 3-0 SH 8-18 (SUTURE) ×4 IMPLANT
SYR 3ML LL SCALE MARK (SYRINGE) IMPLANT
SYR CONTROL 10ML LL (SYRINGE) ×1 IMPLANT
TOWEL GREEN STERILE (TOWEL DISPOSABLE) ×2 IMPLANT
TOWEL GREEN STERILE FF (TOWEL DISPOSABLE) ×2 IMPLANT
TRAY FOLEY MTR SLVR 16FR STAT (SET/KITS/TRAYS/PACK) ×2 IMPLANT
WATER STERILE IRR 1000ML POUR (IV SOLUTION) ×2 IMPLANT

## 2018-12-02 NOTE — Evaluation (Signed)
Physical Therapy Evaluation Patient Details Name: Jill Friedman MRN: [id number] DOB: 03/31/1946 Today's Date: 12/02/2018   History of Present Illness  Pt is a 73 y/o female who presents s/p L4-L5 TLIF on 12/02/2018. PMH significant for RLS, HTN, DM, COPD, arhtritis, anxiety.   Clinical Impression  Pt admitted with above diagnosis. Pt currently with functional limitations due to the deficits listed below (see PT Problem List). At the time of PT eval pt was able to perform transfers and ambulation with gross min guard assist for balance support and safety with IV pole use for assist. Pt may benefit from a SPC vs RW next session. Pt and husband were educated on precautions, brace application/wearing schedule, and general positioning recommendations. Pt will benefit from skilled PT to increase their independence and safety with mobility to allow discharge to the venue listed below.       Follow Up Recommendations No PT follow up;Supervision for mobility/OOB    Equipment Recommendations  None recommended by PT    Recommendations for Other Services       Precautions / Restrictions Precautions Precautions: Fall;Back Precaution Booklet Issued: Yes (comment) Precaution Comments: Reviewed with pt and husband Required Braces or Orthoses: Spinal Brace Spinal Brace: Lumbar corset;Applied in sitting position Restrictions Weight Bearing Restrictions: No      Mobility  Bed Mobility Overal bed mobility: Needs Assistance Bed Mobility: Rolling;Sidelying to Sit;Sit to Sidelying Rolling: Supervision Sidelying to sit: Min guard     Sit to sidelying: Min guard General bed mobility comments: VC's for log roll technique. Pt was able to transition to/from EOB without assist, however hands-on guarding provided for safety.   Transfers Overall transfer level: Needs assistance Equipment used: None Transfers: Sit to/from Stand Sit to Stand: Min guard         General transfer comment: Close guard  for safety as pt powered up to full stand.   Ambulation/Gait Ambulation/Gait assistance: Min guard Gait Distance (Feet): 100 Feet Assistive device: IV Pole Gait Pattern/deviations: Step-through pattern;Decreased stride length;Trunk flexed Gait velocity: Decreased Gait velocity interpretation: 1.31 - 2.62 ft/sec, indicative of limited community ambulator General Gait Details: VC's for sequencing and general safety. Pt reporting lightheadedness throughout gait training. BP 170/94 at end of gait training and RN notified.   Stairs            Wheelchair Mobility    Modified Rankin (Stroke Patients Only)       Balance Overall balance assessment: Needs assistance Sitting-balance support: Feet supported;No upper extremity supported Sitting balance-Leahy Scale: Fair     Standing balance support: No upper extremity supported Standing balance-Leahy Scale: Fair                               Pertinent Vitals/Pain Pain Assessment: Faces Faces Pain Scale: Hurts even more Pain Location: Incision site Pain Descriptors / Indicators: Operative site guarding;Discomfort Pain Intervention(s): Monitored during session;Limited activity within patient's tolerance;Repositioned    Home Living Family/patient expects to be discharged to:: Private residence Living Arrangements: Spouse/significant other Available Help at Discharge: Family;Available 24 hours/day Type of Home: House Home Access: Ramped entrance     Home Layout: One level Home Equipment: Shower seat - built in;Bedside commode      Prior Function Level of Independence: Independent               Hand Dominance        Extremity/Trunk Assessment   Upper Extremity Assessment Upper  Extremity Assessment: Defer to OT evaluation    Lower Extremity Assessment Lower Extremity Assessment: Generalized weakness(Consistent with pre-op diagnosis)    Cervical / Trunk Assessment Cervical / Trunk Assessment:  Other exceptions Cervical / Trunk Exceptions: s/p surgery  Communication   Communication: No difficulties  Cognition Arousal/Alertness: Awake/alert Behavior During Therapy: Flat affect Overall Cognitive Status: Within Functional Limits for tasks assessed                                        General Comments      Exercises     Assessment/Plan    PT Assessment Patient needs continued PT services  PT Problem List Decreased strength;Decreased activity tolerance;Decreased balance;Decreased mobility;Decreased knowledge of use of DME;Decreased safety awareness;Decreased knowledge of precautions;Pain       PT Treatment Interventions DME instruction;Gait training;Stair training;Functional mobility training;Therapeutic activities;Therapeutic exercise;Neuromuscular re-education;Patient/family education    PT Goals (Current goals can be found in the Care Plan section)  Acute Rehab PT Goals Patient Stated Goal: Feel better. No other goals stated PT Goal Formulation: With patient Time For Goal Achievement: 12/09/18 Potential to Achieve Goals: Good    Frequency Min 5X/week   Barriers to discharge        Co-evaluation               AM-PAC PT "6 Clicks" Mobility  Outcome Measure Help needed turning from your back to your side while in a flat bed without using bedrails?: None Help needed moving from lying on your back to sitting on the side of a flat bed without using bedrails?: A Little Help needed moving to and from a bed to a chair (including a wheelchair)?: A Little Help needed standing up from a chair using your arms (e.g., wheelchair or bedside chair)?: A Little Help needed to walk in hospital room?: A Little Help needed climbing 3-5 steps with a railing? : A Little 6 Click Score: 19    End of Session Equipment Utilized During Treatment: Gait belt Activity Tolerance: Patient tolerated treatment well Patient left: in bed;with call bell/phone within  reach;with family/visitor present Nurse Communication: Mobility status PT Visit Diagnosis: Unsteadiness on feet (R26.81);Pain;Other symptoms and signs involving the nervous system (R29.898) Pain - part of body: (back)    Time: 8675-4492 PT Time Calculation (min) (ACUTE ONLY): 23 min   Charges:   PT Evaluation $PT Eval Moderate Complexity: 1 Mod PT Treatments $Gait Training: 8-22 mins        Conni Slipper, PT, DPT Acute Rehabilitation Services Pager: [phone] Office: [phone]   Marylynn Pearson 12/02/2018, 2:58 PM

## 2018-12-02 NOTE — Op Note (Signed)
12/02/2018  9:56 AM  PATIENT:  Jill Friedman  73 y.o. 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 Decompressive lumbar laminectomy L4-5 right requiring more work than would be required for a simple exposure of the disk for TLIF in order to adequately decompress the neural elements and address the spinal stenosis 2.  Transforaminal lumbar interbody fusion L4-5 right using porous titanium interbody cage packed with morcellized allograft and autograft  3. Posterior fixation L4-5 using Alphatec cortical pedicle screws.  4. Intertransverse arthrodesis L4-5 using morcellized autograft and allograft.  SURGEON:  Marikay Alaravid Kalisha Keadle, MD  ASSISTANTS: Verlin DikeKimberly Meyran FNP  ANESTHESIA:  General  EBL: 150 ml  Total I/O In: 1000 [I.V.:1000] Out: 400 [Urine:250; Blood:150]  BLOOD ADMINISTERED:none  DRAINS: none   INDICATION FOR PROCEDURE: This patient presented with severe right leg pain. Imaging revealed dynamic spondylolisthesis L4-5 with right-sided spinal stenosis with disc herniation. The patient tried a reasonable attempt at conservative medical measures without relief. I recommended decompression and instrumented fusion to address the stenosis as well as the segmental  instability.  Patient understood the risks, benefits, and alternatives and potential outcomes and wished to proceed.  PROCEDURE DETAILS:  The patient was brought to the operating room. After induction of generalized endotracheal anesthesia the patient was rolled into the prone position on chest rolls and all pressure points were padded. The patient's lumbar region was cleaned and then prepped with DuraPrep and draped in the usual sterile fashion. Anesthesia was injected and then a dorsal midline incision was made and carried down to the lumbosacral fascia. The fascia was opened and the paraspinous musculature  was taken down in a subperiosteal fashion to expose L4-5. A self-retaining retractor was placed. Intraoperative fluoroscopy confirmed my level, and I started with placement of the L4 and left L5 cortical pedicle screws. The pedicle screw entry zones were identified utilizing surface landmarks and  AP and lateral fluoroscopy. I scored the cortex with the high-speed drill and then used the hand drill to drill an upward and outward direction into the pedicle. I then tapped line to line. I then placed a 5.5 x 5 mm cortical pedicle screw into the pedicles of L4 bilaterally L5 on the left.    I then turned my attention to the decompression and complete lumbar laminectomies, hemi- facetectomies, and foraminotomies were performed at L4-5 right. The patient had significant spinal stenosis and this required more work than would be required for a simple exposure of the disc for posterior lumbar interbody fusion which would only require a limited laminotomy. Much more generous decompression and generous foraminotomy was undertaken in order to adequately decompress the neural elements and address the patient's leg pain. The yellow ligament was removed to expose the underlying dura and nerve roots, and a generous foraminotomy were performed to adequately decompress the neural elements. Both the exiting and traversing nerve roots were decompressed until a coronary dilator passed easily along the nerve roots. Once the decompression was complete, I turned my attention to the transforaminal lower lumbar interbody fusion. The epidural venous vasculature was coagulated and cut sharply. Disc space was incised and the initial discectomy was performed with pituitary rongeurs. The disc space was distracted with an interlaminar spreader. We then used a series of scrapers and shavers to prepare the endplates for fusion. The midline was prepared with Epstein curettes. Once the complete discectomy was finished, we used sequential trials  until  the 11 mm trial fit best.  We then packed the disc space with morselized autograft and allograft.  We packed an appropriate sized 11 mm interbody cage which matched our trials with local autograft and morcellized allograft, gently retracted the nerve root, and tapped the cage into position at L4-5 from the right.  We then turned our attention to the placement of the lower pedicle screw on the right. The pedicle screw entry zone was identified utilizing surface landmarks and fluoroscopy. I drilled into the pedicle utilizing the hand drill, and tapped each pedicle with the appropriate tap. We palpated with a ball probe to assure no break in the cortex. We then placed 5.5 x 35 mm cortical pedicle screw into the pedicle at L5 on the right. We then decorticated the lamina of L4 and L5 on the left and laid a mixture of morcellized autograft and allograft out over these to perform history arthrodesis. We then placed lordotic rods into the multiaxial screw heads of the pedicle screws and locked these in position with the locking caps and anti-torque device. We then checked our construct with AP and lateral fluoroscopy. Irrigated with copious amounts of bacitracin-containing saline solution. Inspected the nerve roots once again to assure adequate decompression, lined to the dura with Gelfoam, placed powdered vancomycin into the wound, and closed the muscle and the fascia with 0 Vicryl. Closed the subcutaneous tissues with 2-0 Vicryl and subcuticular tissues with 3-0 Vicryl. The skin was closed with benzoin and Steri-Strips. Dressing was then applied, the patient was awakened from general anesthesia and transported to the recovery room in stable condition. At the end of the procedure all sponge, needle and instrument counts were correct.   PLAN OF CARE: admit to inpatient  PATIENT DISPOSITION:  PACU - hemodynamically stable.   Delay start of Pharmacological VTE agent (>24hrs) due to surgical blood loss or risk of  bleeding:  yes

## 2018-12-02 NOTE — Anesthesia Postprocedure Evaluation (Signed)
Anesthesia Post Note  Patient: Jill Friedman  Procedure(s) Performed: TRANSFORAMINAL LUMBAR INTERBODY FUSION - right - LUMBAR FOUR-LUMBAR FIVE - Interbody Fusion (Right Back)     Patient location during evaluation: Nursing Unit Anesthesia Type: General Level of consciousness: awake and alert Pain management: pain level controlled Vital Signs Assessment: post-procedure vital signs reviewed and stable Respiratory status: spontaneous breathing, nonlabored ventilation, respiratory function stable and patient connected to nasal cannula oxygen Cardiovascular status: blood pressure returned to baseline and stable Postop Assessment: no apparent nausea or vomiting Anesthetic complications: no    Last Vitals:  Vitals:   12/02/18 1548 12/02/18 2035  BP: 137/89 (!) 150/72  Pulse: 74 67  Resp: 16 16  Temp: 36.8 C 36.7 C  SpO2: 95% 94%    Last Pain:  Vitals:   12/02/18 2035  TempSrc: Oral  PainSc:                  [name]

## 2018-12-02 NOTE — Progress Notes (Signed)
Patient ID: Jill Friedman, female   DOB: 04-23-1946, 74 y.o.   MRN: [id number] Doing really well, back sore, no leg pain or NTW, home in am

## 2018-12-02 NOTE — Anesthesia Procedure Notes (Signed)
Procedure Name: Intubation Date/Time: 12/02/2018 7:39 AM Performed by: Lovie Chol, CRNA Pre-anesthesia Checklist: Patient identified, Emergency Drugs available, Suction available and Patient being monitored Patient Re-evaluated:Patient Re-evaluated prior to induction Oxygen Delivery Method: Circle System Utilized Preoxygenation: Pre-oxygenation with 100% oxygen Induction Type: IV induction Ventilation: Mask ventilation without difficulty Laryngoscope Size: Miller and 2 Grade View: Grade I Tube type: Oral Tube size: 7.0 mm Number of attempts: 1 Airway Equipment and Method: Stylet and Oral airway Placement Confirmation: ETT inserted through vocal cords under direct vision,  positive ETCO2 and breath sounds checked- equal and bilateral Secured at: 20 cm Tube secured with: Tape Dental Injury: Teeth and Oropharynx as per pre-operative assessment

## 2018-12-02 NOTE — Progress Notes (Signed)
Orthopedic Tech Progress Note Patient Details:  ALLYCIA BIRTH Aug 23, 1946 [id number] Patient has brace already Patient ID: Ellwood Handler, female   DOB: 1946-01-25, 73 y.o.   MRN: [id number]   Donald Pore 12/02/2018, 11:57 AM

## 2018-12-02 NOTE — Transfer of Care (Signed)
Immediate Anesthesia Transfer of Care Note  Patient: Jill Friedman  Procedure(s) Performed: TRANSFORAMINAL LUMBAR INTERBODY FUSION - right - LUMBAR FOUR-LUMBAR FIVE - Interbody Fusion (Right Back)  Patient Location: PACU  Anesthesia Type:General  Level of Consciousness: awake, oriented and patient cooperative  Airway & Oxygen Therapy: Patient Spontanous Breathing and Patient connected to face mask oxygen  Post-op Assessment: Report given to RN and Post -op Vital signs reviewed and stable  Post vital signs: Reviewed  Last Vitals:  Vitals Value Taken Time  BP 157/82 12/02/2018 10:03 AM  Temp    Pulse 73 12/02/2018 10:04 AM  Resp 10 12/02/2018 10:04 AM  SpO2 98 % 12/02/2018 10:04 AM  Vitals shown include unvalidated device data.  Last Pain:  Vitals:   12/02/18 0634  PainSc: 8          Complications: No apparent anesthesia complications

## 2018-12-02 NOTE — H&P (Signed)
Subjective: Patient is a 73 y.o. female admitted for R leg pain. Onset of symptoms was several months ago, gradually worsening since that time.  The pain is rated severe, and is located at the across the lower back and radiates to RLE. The pain is described as aching and occurs all day. The symptoms have been progressive. Symptoms are exacerbated by exercise. 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 Anxiety   . Arthritis   . COPD (chronic obstructive pulmonary disease) (HCC)   . Diabetes mellitus without complication (HCC)   . Dyspnea   . Hypertension   . Hypothyroidism   . RLS (restless legs syndrome)     Past Surgical History:  Procedure Laterality Date  . ABDOMINAL HYSTERECTOMY    . BACK SURGERY    . CARPAL TUNNEL RELEASE    . HERNIA REPAIR      Prior to Admission medications   Medication Sig Start Date End Date Taking? Authorizing Provider  gabapentin (NEURONTIN) 600 MG tablet Take 600 mg by mouth 2 (two) times daily.   Yes [provider]  levothyroxine (SYNTHROID, LEVOTHROID) 75 MCG tablet Take 75 mcg by mouth daily before breakfast.   Yes [provider]  lisinopril (PRINIVIL,ZESTRIL) 5 MG tablet Take 5 mg by mouth 2 (two) times daily.    Yes [provider]  metFORMIN (GLUCOPHAGE) 1000 MG tablet Take 1,000 mg by mouth 2 (two) times daily with a meal.   Yes [provider]  Multiple Vitamins-Minerals (CENTRUM SILVER 50+WOMEN) TABS Take 1 tablet by mouth daily.   Yes [provider]  verapamil (CALAN) 80 MG tablet Take 80 mg by mouth 2 (two) times daily.   Yes [provider]  vitamin B-12 (CYANOCOBALAMIN) 1000 MCG tablet Take 1,000 mcg by mouth daily.   Yes [provider]  Cholecalciferol (VITAMIN D-1000 MAX ST) 25 MCG (1000 UT) tablet Take 1,000 Units by mouth daily.    [provider]  folic acid (FOLVITE) 1 MG tablet Take 1 mg by mouth daily.     [provider]  glimepiride (AMARYL) 4 MG tablet Take 8 mg by mouth daily with breakfast.    [provider]   Allergies  Allergen Reactions  . Codeine Other (See Comments)    Dizziness, hallucinations, confused  . Other     Pt states she cant a medication called "tawin"  . Oxycontin [Oxycodone Hcl] Other (See Comments)    Dizziness, hallucinations, confused    Social History   Tobacco Use  . Smoking status: Current Every Day Smoker    Packs/day: 1.00    Years: 55.00    Pack years: 55.00  . Smokeless tobacco: Never Used  Substance Use Topics  . Alcohol use: Not Currently    History reviewed. No pertinent family history.   Review of Systems  Positive ROS: neg  All other systems have been reviewed and were otherwise negative with the exception of those mentioned in the HPI and as above.  Objective: Vital signs in last 24 hours: Temp:  [98.3 F (36.8 C)] 98.3 F (36.8 C) (01/16 0621) Pulse Rate:  [71] 71 (01/16 0621) Resp:  [18] 18 (01/16 0621) BP: (160)/(80) 160/80 (01/16 0621) SpO2:  [97 %] 97 % (01/16 0621) Weight:  [54.4 kg] 54.4 kg (01/16 0621)  General Appearance: Alert, cooperative, no distress, appears stated age Head: Normocephalic, without obvious abnormality, atraumatic Eyes: PERRL, conjunctiva/corneas clear, EOM's intact  Neck: Supple, symmetrical, trachea midline Back: Symmetric, no curvature, ROM normal, no CVA tenderness Lungs:  respirations unlabored Heart: Regular rate and rhythm Abdomen: Soft, non-tender Extremities: Extremities normal, atraumatic, no cyanosis or edema Pulses: 2+ and symmetric all extremities Skin: Skin color, texture, turgor normal, no rashes or lesions  NEUROLOGIC:   Mental status: Alert and oriented x4,  no aphasia, good attention span, fund of knowledge, and memory Motor Exam - grossly normal Sensory Exam - grossly normal Reflexes: trace Coordination - grossly normal Gait - grossly normal Balance -  grossly normal Cranial Nerves: I: smell Not tested  II: visual acuity  OS: nl    OD: nl  II: visual fields Full to confrontation  II: pupils Equal, round, reactive to light  III,VII: ptosis None  III,IV,VI: extraocular muscles  Full ROM  V: mastication Normal  V: facial light touch sensation  Normal  V,VII: corneal reflex  Present  VII: facial muscle function - upper  Normal  VII: facial muscle function - lower Normal  VIII: hearing Not tested  IX: soft palate elevation  Normal  IX,X: gag reflex Present  XI: trapezius strength  5/5  XI: sternocleidomastoid strength 5/5  XI: neck flexion strength  5/5  XII: tongue strength  Normal    Data Review Lab Results  Component Value Date   WBC 11.6 (H) 11/24/2018   HGB 14.8 11/24/2018   HCT 46.3 (H) 11/24/2018   MCV 94.3 11/24/2018   PLT 261 11/24/2018   Lab Results  Component Value Date   NA 140 11/24/2018   K 4.0 11/24/2018   CL 105 11/24/2018   CO2 23 11/24/2018   BUN 22 11/24/2018   CREATININE 1.28 (H) 11/24/2018   GLUCOSE 67 (L) 11/24/2018   Lab Results  Component Value Date   INR 0.93 11/24/2018    Assessment/Plan:  Estimated body mass index is 21.6 kg/m as calculated from the following:   Height as of this encounter: 5' 2.5" (1.588 m).   Weight as of this encounter: 54.4 kg. Patient admitted for TLIF L4-5. Patient has failed a reasonable attempt at conservative therapy.  I explained the condition and procedure to the patient and answered any questions.  Patient wishes to proceed with procedure as planned. Understands risks/ benefits and typical outcomes of procedure.   [name] 12/02/2018 7:04 AM

## 2018-12-03 LAB — GLUCOSE, CAPILLARY: Glucose-Capillary: 138 mg/dL — ABNORMAL HIGH (ref 70–99)

## 2018-12-03 MED ORDER — HYDROMORPHONE HCL 2 MG PO TABS: 2.0000 mg | ORAL_TABLET | ORAL | 0 refills | Status: DC | PRN

## 2018-12-03 MED ORDER — METHOCARBAMOL 500 MG PO TABS: 500.0000 mg | ORAL_TABLET | Freq: Four times a day (QID) | ORAL | 1 refills | Status: DC | PRN

## 2018-12-03 NOTE — Progress Notes (Signed)
Patient alert and oriented, mae's well, voiding adequate amount of urine, swallowing without difficulty, no c/o pain at time of discharge. Patient discharged home with family. Script and discharged instructions given to patient. Patient and family stated understanding of instructions given. Patient has an appointment with Dr. Jones °

## 2018-12-03 NOTE — Progress Notes (Signed)
Occupational Therapy Evaluation Patient Details Name: Jill Friedman MRN: [id number] DOB: Oct 15, 1946 Today's Date: 12/03/2018    History of Present Illness Pt is a 73 y/o female who presents s/p L4-L5 TLIF on 12/02/2018. PMH significant for RLS, HTN, DM, COPD, arhtritis, anxiety.    Clinical Impression   PTA pt was living at home with her husband and was independent with ADL/IADL and functional mobility with intermittent use of SPC. Pt reports she was driving PTA and her husband is unable to drive. Pt currently requires S for ADL and functional mobility for adherence to precautions. Pt demonstrated good carry over from PT session yesterday and donned back brace independently. Anticipate pt will continue to progress to baseline functioning upon return home with assistance from her husband. No further acute OT needs identified. All education has been complete and the patient and her husband have no additional questions. OT to sign off. Thank you for referral.      Follow Up Recommendations  No OT follow up;Supervision - Intermittent(OOB mobility )    Equipment Recommendations  None recommended by OT    Recommendations for Other Services       Precautions / Restrictions Precautions Precautions: Fall;Back Precaution Booklet Issued: Yes (comment) Precaution Comments: Reviewed with pt and husband Required Braces or Orthoses: Spinal Brace Spinal Brace: Lumbar corset;Applied in sitting position Restrictions Weight Bearing Restrictions: No      Mobility Bed Mobility Overal bed mobility: Modified Independent Bed Mobility: Rolling;Sidelying to Sit;Sit to Sidelying           General bed mobility comments: pt was able to transition to/from EOB with mod I.   Transfers Overall transfer level: Needs assistance Equipment used: None Transfers: Sit to/from Stand Sit to Stand: Supervision         General transfer comment: S for adherence to precautions with initial sit to stand     Balance Overall balance assessment: Mild deficits observed, not formally tested Sitting-balance support: Feet supported;No upper extremity supported Sitting balance-Leahy Scale: Good     Standing balance support: No upper extremity supported Standing balance-Leahy Scale: Fair                             ADL either performed or assessed with clinical judgement   ADL Overall ADL's : Needs assistance/impaired                                     Functional mobility during ADLs: Supervision/safety General ADL Comments: pt required S for safety and adherence to precautions. pt able to adjust socks while sitting EOB using figure 4;pt able to return demonstrate adherence to precautions with grooming, toileting, and dressing. Pt donned back brace independently     Vision         Perception     Praxis      Pertinent Vitals/Pain Pain Assessment: 0-10 Pain Score: 7  Pain Location: incision site Pain Descriptors / Indicators: Operative site guarding;Discomfort Pain Intervention(s): Monitored during session     Hand Dominance     Extremity/Trunk Assessment Upper Extremity Assessment Upper Extremity Assessment: Overall WFL for tasks assessed   Lower Extremity Assessment Lower Extremity Assessment: Defer to PT evaluation   Cervical / Trunk Assessment Cervical / Trunk Assessment: Other exceptions Cervical / Trunk Exceptions: s/p surgery   Communication Communication Communication: No difficulties   Cognition Arousal/Alertness: Awake/alert Behavior  During Therapy: Flat affect Overall Cognitive Status: Within Functional Limits for tasks assessed                                 General Comments: pt able to problem solve appropriate ways to adhere to precautions during ADL    General Comments  pt's husband present during session. Educated pt and husband on environmental modifications for adherence to back precautions.     Exercises      Shoulder Instructions      Home Living Family/patient expects to be discharged to:: Private residence Living Arrangements: Spouse/significant other Available Help at Discharge: Family;Available 24 hours/day Type of Home: House Home Access: Ramped entrance     Home Layout: One level     Bathroom Shower/Tub: Producer, television/film/video: Standard     Home Equipment: Environmental consultant - 2 wheels;Cane - single point;Grab bars - toilet;Adaptive equipment;Shower Engineering geologist: Reacher        Prior Functioning/Environment Level of Independence: Independent        Comments: pt was driving, pt's husband is unable to drive        OT Problem List: Impaired balance (sitting and/or standing);Decreased safety awareness;Decreased knowledge of precautions;Pain      OT Treatment/Interventions:      OT Goals(Current goals can be found in the care plan section) Acute Rehab OT Goals Patient Stated Goal: to go home OT Goal Formulation: With patient/family Time For Goal Achievement: 12/17/18 Potential to Achieve Goals: Good  OT Frequency:     Barriers to D/C:            Co-evaluation              AM-PAC OT "6 Clicks" Daily Activity     Outcome Measure Help from another person eating meals?: None Help from another person taking care of personal grooming?: None Help from another person toileting, which includes using toliet, bedpan, or urinal?: None Help from another person bathing (including washing, rinsing, drying)?: None Help from another person to put on and taking off regular upper body clothing?: None Help from another person to put on and taking off regular lower body clothing?: None 6 Click Score: 24   End of Session Equipment Utilized During Treatment: Gait belt;Back brace Nurse Communication: Mobility status  Activity Tolerance: Patient tolerated treatment well Patient left: in bed;with call bell/phone within reach;with family/visitor present  OT  Visit Diagnosis: Unsteadiness on feet (R26.81);Other abnormalities of gait and mobility (R26.89);Pain Pain - part of body: (incision site )                Time: 0626-9485 OT Time Calculation (min): 23 min Charges:  OT General Charges $OT Visit: 1 Visit OT Evaluation $OT Eval Low Complexity: 1 Low OT Treatments $Self Care/Home Management : 8-22 mins  Diona Browner OTR/L Acute Rehabilitation Services Office: [phone]   Rebeca Alert 12/03/2018, 9:09 AM

## 2018-12-03 NOTE — Discharge Summary (Signed)
Physician Discharge Summary  Patient ID: Jill HandlerLovetta E Friedman MRN: [id number] DOB/AGE: 72-Nov-1947 72 y.o.  Admit date: 12/02/2018 Discharge date: 12/03/2018  Admission Diagnoses: spondylolisthesis l4-5    Discharge Diagnoses: same   Discharged Condition: good  Hospital Course: The patient was admitted on 12/02/2018 and taken to the operating room where the patient underwent TLIF L4-5. The patient tolerated the procedure well and was taken to the recovery room and then to the floor in stable condition. The hospital course was routine. There were no complications. The wound remained clean dry and intact. Pt had appropriate back soreness. No complaints of leg pain or new N/T/W. The patient remained afebrile with stable vital signs, and tolerated a regular diet. The patient continued to increase activities, and pain was well controlled with oral pain medications.   Consults: None  Significant Diagnostic Studies:  Results for orders placed or performed during the hospital encounter of 12/02/18  Glucose, capillary  Result Value Ref Range   Glucose-Capillary 138 (H) 70 - 99 mg/dL   Comment 1 Notify RN    Comment 2 Document in Chart   Glucose, capillary  Result Value Ref Range   Glucose-Capillary 154 (H) 70 - 99 mg/dL   Comment 1 Notify RN    Comment 2 Document in Chart   Glucose, capillary  Result Value Ref Range   Glucose-Capillary 261 (H) 70 - 99 mg/dL  Glucose, capillary  Result Value Ref Range   Glucose-Capillary 198 (H) 70 - 99 mg/dL  Glucose, capillary  Result Value Ref Range   Glucose-Capillary 190 (H) 70 - 99 mg/dL   Comment 1 Notify RN    Comment 2 Document in Chart   Glucose, capillary  Result Value Ref Range   Glucose-Capillary 138 (H) 70 - 99 mg/dL   Comment 1 Notify RN    Comment 2 Document in Chart     Chest 2 View  Result Date: 11/25/2018 CLINICAL DATA:  Dyspnea. EXAM: CHEST - 2 VIEW COMPARISON:  Radiograph of June 09, 2005. FINDINGS: The heart size and  mediastinal contours are within normal limits. Both lungs are clear. No pneumothorax or pleural effusion is noted. The visualized skeletal structures are unremarkable. IMPRESSION: No active cardiopulmonary disease. Electronically Signed   By: Lupita RaiderJames  Green Jr, M.D.   On: 11/25/2018 08:14    Antibiotics:  Anti-infectives (From admission, onward)   Start     Dose/Rate Route Frequency Ordered Stop   12/02/18 1200  ceFAZolin (ANCEF) IVPB 2g/100 mL premix     2 g 200 mL/hr over 30 Minutes Intravenous Every 8 hours 12/02/18 1147 12/02/18 2050   12/02/18 0937  vancomycin (VANCOCIN) powder  Status:  Discontinued       As needed 12/02/18 0937 12/02/18 0957   12/02/18 0815  bacitracin 50,000 Units in sodium chloride 0.9 % 500 mL irrigation  Status:  Discontinued       As needed 12/02/18 0815 12/02/18 0957   12/02/18 0615  ceFAZolin (ANCEF) IVPB 2g/100 mL premix     2 g 200 mL/hr over 30 Minutes Intravenous On call to O.R. 12/02/18 0612 12/02/18 0801      Discharge Exam: Blood pressure 126/85, pulse 71, temperature 98.3 F (36.8 C), temperature source Oral, resp. rate 16, height 5' 2.5" (1.588 m), weight 54.4 kg, SpO2 92 %. Neurologic: Grossly normal Dressing dry  Discharge Medications:   Allergies as of 12/03/2018      Reactions   Codeine Other (See Comments)   Dizziness, hallucinations, confused  Other    Pt states she cant a medication called "tawin"   Oxycontin [oxycodone Hcl] Other (See Comments)   Dizziness, hallucinations, confused      Medication List    TAKE these medications   CENTRUM SILVER 50+WOMEN Tabs Take 1 tablet by mouth daily.   folic acid 1 MG tablet Commonly known as:  FOLVITE Take 1 mg by mouth daily.   gabapentin 600 MG tablet Commonly known as:  NEURONTIN Take 600 mg by mouth 2 (two) times daily.   glimepiride 4 MG tablet Commonly known as:  AMARYL Take 8 mg by mouth daily with breakfast.   HYDROmorphone 2 MG tablet Commonly known as:  DILAUDID Take  1 tablet (2 mg total) by mouth every 4 (four) hours as needed for severe pain.   levothyroxine 75 MCG tablet Commonly known as:  SYNTHROID, LEVOTHROID Take 75 mcg by mouth daily before breakfast.   lisinopril 5 MG tablet Commonly known as:  PRINIVIL,ZESTRIL Take 5 mg by mouth 2 (two) times daily.   metFORMIN 1000 MG tablet Commonly known as:  GLUCOPHAGE Take 1,000 mg by mouth 2 (two) times daily with a meal.   methocarbamol 500 MG tablet Commonly known as:  ROBAXIN Take 1 tablet (500 mg total) by mouth every 6 (six) hours as needed for muscle spasms.   verapamil 80 MG tablet Commonly known as:  CALAN Take 80 mg by mouth 2 (two) times daily.   vitamin B-12 1000 MCG tablet Commonly known as:  CYANOCOBALAMIN Take 1,000 mcg by mouth daily.   VITAMIN D-1000 MAX ST 25 MCG (1000 UT) tablet Generic drug:  Cholecalciferol Take 1,000 Units by mouth daily.            Durable Medical Equipment  (From admission, onward)         Start     Ordered   12/02/18 1148  DME Walker rolling  Once    Question:  Patient needs a walker to treat with the following condition  Answer:  S/P lumbar fusion   12/02/18 1147   12/02/18 1148  DME 3 n 1  Once     12/02/18 1147          Disposition: home   Final Dx: TLIF L4-5  Discharge Instructions     Remove dressing in 72 hours   Complete by:  As directed    Call MD for:  difficulty breathing, headache or visual disturbances   Complete by:  As directed    Call MD for:  persistant nausea and vomiting   Complete by:  As directed    Call MD for:  redness, tenderness, or signs of infection (pain, swelling, redness, odor or green/yellow discharge around incision site)   Complete by:  As directed    Call MD for:  severe uncontrolled pain   Complete by:  As directed    Call MD for:  temperature >100.4   Complete by:  As directed    Diet - low sodium heart healthy   Complete by:  As directed    Increase activity slowly   Complete by:  As  directed          Signed: Tia Alertavid S Deangela Randleman 12/03/2018, 7:51 AM

## 2018-12-03 NOTE — Progress Notes (Signed)
Physical Therapy Treatment and Discharge Patient Details Name: Jill Friedman MRN: [id number] DOB: 02/10/1946 Today's Date: 12/03/2018    History of Present Illness Pt is a 73 y/o female who presents s/p L4-L5 TLIF on 12/02/2018. PMH significant for RLS, HTN, DM, COPD, arhtritis, anxiety.     PT Comments    Pt progressing towards physical therapy goals. Was able to perform transfers and ambulation with gross modified independence and no AD. Pt was educated on car transfer, precautions, activity progression, brace application/wearing schedule, and positioning recommendations (home and in car). Will sign off at this time. If needs change, please reconsult.    Follow Up Recommendations  No PT follow up;Supervision for mobility/OOB     Equipment Recommendations  None recommended by PT    Recommendations for Other Services       Precautions / Restrictions Precautions Precautions: Fall;Back Precaution Booklet Issued: Yes (comment) Precaution Comments: Reviewed with pt and husband Required Braces or Orthoses: Spinal Brace Spinal Brace: Lumbar corset;Applied in sitting position Restrictions Weight Bearing Restrictions: No    Mobility  Bed Mobility Overal bed mobility: Modified Independent Bed Mobility: Rolling;Sidelying to Sit;Sit to Sidelying           General bed mobility comments: Pt sitting EOB when PT arrived.   Transfers Overall transfer level: Modified independent Equipment used: None Transfers: Sit to/from Stand Sit to Stand: Supervision         General transfer comment: No assist required. No unsteadiness/LOB noted.   Ambulation/Gait Ambulation/Gait assistance: Modified independent (Device/Increase time) Gait Distance (Feet): 400 Feet Assistive device: None Gait Pattern/deviations: Step-through pattern;Decreased stride length;Trunk flexed Gait velocity: Decreased Gait velocity interpretation: 1.31 - 2.62 ft/sec, indicative of limited community  ambulator General Gait Details: Pt ambulating well without UE support. Pt did not complain of lightheadedness, and did not seem limited by pain.    Stairs             Wheelchair Mobility    Modified Rankin (Stroke Patients Only)       Balance Overall balance assessment: Mild deficits observed, not formally tested Sitting-balance support: Feet supported;No upper extremity supported Sitting balance-Leahy Scale: Good     Standing balance support: No upper extremity supported Standing balance-Leahy Scale: Fair                              Cognition Arousal/Alertness: Awake/alert Behavior During Therapy: Flat affect Overall Cognitive Status: Within Functional Limits for tasks assessed                                 General Comments: pt able to problem solve appropriate ways to adhere to precautions during ADL       Exercises      General Comments General comments (skin integrity, edema, etc.): pt's husband present during session. Educated pt and husband on environmental modifications for adherence to back precautions.       Pertinent Vitals/Pain Pain Assessment: Faces Pain Score: 7  Faces Pain Scale: Hurts a little bit Pain Location: incision site - pt stating 10/10 pain however does not seem to be limited by pain and not grimacing/guarding.  Pain Descriptors / Indicators: Operative site guarding;Discomfort Pain Intervention(s): Monitored during session    Home Living Family/patient expects to be discharged to:: Private residence Living Arrangements: Spouse/significant other Available Help at Discharge: Family;Available 24 hours/day Type of Home: House Home Access:  Ramped entrance   Home Layout: One level Home Equipment: Walker - 2 wheels;Cane - single point;Grab bars - toilet;Adaptive equipment;Shower seat      Prior Function Level of Independence: Independent      Comments: pt was driving, pt's husband is unable to drive    PT Goals (current goals can now be found in the care plan section) Acute Rehab PT Goals Patient Stated Goal: to go home PT Goal Formulation: With patient Time For Goal Achievement: 12/09/18 Potential to Achieve Goals: Good Progress towards PT goals: Progressing toward goals    Frequency    Min 5X/week      PT Plan Current plan remains appropriate    Co-evaluation              AM-PAC PT "6 Clicks" Mobility   Outcome Measure  Help needed turning from your back to your side while in a flat bed without using bedrails?: None Help needed moving from lying on your back to sitting on the side of a flat bed without using bedrails?: A Little Help needed moving to and from a bed to a chair (including a wheelchair)?: A Little Help needed standing up from a chair using your arms (e.g., wheelchair or bedside chair)?: A Little Help needed to walk in hospital room?: A Little Help needed climbing 3-5 steps with a railing? : A Little 6 Click Score: 19    End of Session Equipment Utilized During Treatment: Gait belt Activity Tolerance: Patient tolerated treatment well Patient left: in bed;with call bell/phone within reach;with family/visitor present Nurse Communication: Mobility status PT Visit Diagnosis: Unsteadiness on feet (R26.81);Pain;Other symptoms and signs involving the nervous system (R29.898) Pain - part of body: (back)     Time: 0092-3300 PT Time Calculation (min) (ACUTE ONLY): 10 min  Charges:  $Gait Training: 8-22 mins                     Conni Slipper, PT, DPT Acute Rehabilitation Services Pager: [phone] Office: [phone]    Jill Friedman 12/03/2018, 10:02 AM

## 2018-12-06 ENCOUNTER — Encounter (HOSPITAL_COMMUNITY): Payer: Self-pay | Admitting: Neurological Surgery

## 2018-12-31 ENCOUNTER — Ambulatory Visit (HOSPITAL_COMMUNITY)
Admission: RE | Admit: 2018-12-31 | Discharge: 2018-12-31 | Disposition: A | Discharge status: Home / Self Care | Payer: Medicare HMO | Source: Ambulatory Visit | Attending: Vascular Surgery | Admitting: Vascular Surgery

## 2018-12-31 ENCOUNTER — Other Ambulatory Visit (HOSPITAL_COMMUNITY): Payer: Self-pay | Admitting: Student

## 2018-12-31 DIAGNOSIS — M79661 Pain in right lower leg: Secondary | ICD-10-CM | POA: Diagnosis present

## 2018-12-31 NOTE — Progress Notes (Signed)
Performed RLE Venous Duplex. Relayed preliminary results to [facility] at [facility]. Patient instructed to go home.

## 2020-07-13 ENCOUNTER — Ambulatory Visit: Payer: Medicare HMO | Admitting: Orthopaedic Surgery

## 2020-07-18 ENCOUNTER — Ambulatory Visit: Payer: Medicare HMO | Admitting: Orthopaedic Surgery

## 2020-07-24 ENCOUNTER — Ambulatory Visit (INDEPENDENT_AMBULATORY_CARE_PROVIDER_SITE_OTHER): Payer: Medicare HMO

## 2020-07-24 ENCOUNTER — Encounter: Payer: Self-pay | Admitting: Orthopaedic Surgery

## 2020-07-24 ENCOUNTER — Ambulatory Visit: Payer: Self-pay

## 2020-07-24 ENCOUNTER — Ambulatory Visit: Payer: Medicare HMO | Admitting: Orthopaedic Surgery

## 2020-07-24 VITALS — Ht 61.0 in | Wt 117.6 lb

## 2020-07-24 DIAGNOSIS — M5441 Lumbago with sciatica, right side: Secondary | ICD-10-CM

## 2020-07-24 DIAGNOSIS — M25552 Pain in left hip: Secondary | ICD-10-CM | POA: Diagnosis not present

## 2020-07-24 DIAGNOSIS — M5442 Lumbago with sciatica, left side: Secondary | ICD-10-CM | POA: Diagnosis not present

## 2020-07-24 DIAGNOSIS — G8929 Other chronic pain: Secondary | ICD-10-CM

## 2020-07-24 DIAGNOSIS — M25551 Pain in right hip: Secondary | ICD-10-CM

## 2020-07-24 DIAGNOSIS — M25559 Pain in unspecified hip: Secondary | ICD-10-CM | POA: Diagnosis not present

## 2020-07-24 MED ORDER — PREDNISONE 10 MG (21) PO TBPK: ORAL_TABLET | ORAL | 0 refills | Status: DC

## 2020-07-24 MED ORDER — MELOXICAM 7.5 MG PO TABS: 7.5000 mg | ORAL_TABLET | Freq: Two times a day (BID) | ORAL | 2 refills | Status: DC | PRN

## 2020-07-24 NOTE — Progress Notes (Signed)
Office Visit Note   Patient: Jill Friedman           Date of Birth: [birth year]           MRN: [id number] Visit Date: 07/24/2020              Requested by: [place] [address] [place],  [place] [address] PCP: Jill Fuller, MD   Assessment & Plan: Visit Diagnoses:  1. Chronic bilateral low back pain with bilateral sciatica   2. Hip pain     Plan: Impression is left hip osteoarthritis and lumbar spine spondylosis possible adjacent level disease.  Based on findings I have recommended trying a left hip joint injection under ultrasound with Dr. Prince Friedman which she is agreeable to.  We will send in a prescription for prednisone Dosepak as well as meloxicam for the back pain.  I have recommended that she follow-up with Dr. Yetta Friedman if she continues to have this back pain.  Follow-up as needed.  Follow-Up Instructions: No follow-ups on file.   Orders:  Orders Placed This Encounter  Procedures  . XR HIP UNILAT W OR W/O PELVIS 2-3 VIEWS LEFT  . XR HIP UNILAT W OR W/O PELVIS 1V RIGHT  . XR Lumbar Spine 2-3 Views   Meds ordered this encounter  Medications  . predniSONE (STERAPRED UNI-PAK 21 TAB) 10 MG (21) TBPK tablet    Sig: Take as directed    Dispense:  21 tablet    Refill:  0  . meloxicam (MOBIC) 7.5 MG tablet    Sig: Take 1 tablet (7.5 mg total) by mouth 2 (two) times daily as needed for pain.    Dispense:  30 tablet    Refill:  2      Procedures: No procedures performed   Clinical Data: No additional findings.   Subjective: Chief Complaint  Patient presents with  . Left Hip - Pain  . Right Hip - Pain  . Lower Back - Pain    [name] is a 74 year old female comes in for evaluation of bilateral hip pain.  She has left groin pain and right lower back pain without radiation.  Is currently taking ibuprofen and Tylenol which do not help.  Denies any bowel or bladder dysfunction.  Underwent a lumbar spine fusion surgery in 2020 by Dr. Marikay Friedman of  neurosurgery.  She has also had an ESI by [facility] at their office.  The pain has been increasing recently.  Denies any injuries.   Review of Systems  Constitutional: Negative.   HENT: Negative.   Eyes: Negative.   Respiratory: Negative.   Cardiovascular: Negative.   Endocrine: Negative.   Musculoskeletal: Negative.   Neurological: Negative.   Hematological: Negative.   Psychiatric/Behavioral: Negative.   All other systems reviewed and are negative.    Objective: Vital Signs: Ht 5\' 1"  (1.549 m)   Wt 117 lb 9.6 oz (53.3 kg)   BMI 22.22 kg/m   Physical Exam Vitals and nursing note reviewed.  Constitutional:      Appearance: She is well-developed.  HENT:     Head: Normocephalic and atraumatic.  Pulmonary:     Effort: Pulmonary effort is normal.  Abdominal:     Palpations: Abdomen is soft.  Musculoskeletal:     Cervical back: Neck supple.  Skin:    General: Skin is warm.     Capillary Refill: Capillary refill takes less than 2 seconds.  Neurological:     Mental Status: She is  Friedman and oriented to person, place, and time.  Psychiatric:        Behavior: Behavior normal.        Thought Content: Thought content normal.        Judgment: Judgment normal.     Ortho Exam Left hip exam shows decent range of motion without significant discomfort.  Negative Stinchfield negative logroll negative sciatic tension.  Trochanteric bursa is nontender.  Right hip shows negative logroll negative Stinchfield negative sciatic tension signs.  Trochanteric bursa is nontender.  Increased pain in the right lower back with FABER testing. Specialty Comments:  No specialty comments available.  Imaging: XR HIP UNILAT W OR W/O PELVIS 1V RIGHT  Result Date: 07/24/2020 Moderate right hip osteoarthritis with joint space narrowing  XR HIP UNILAT W OR W/O PELVIS 2-3 VIEWS LEFT  Result Date: 07/24/2020 Moderate left hip osteoarthritis with joint space narrowing  XR Lumbar Spine 2-3  Views  Result Date: 07/24/2020 Status post lumbar spine fusion without any acute hardware complications.  Lumbar spondylosis    PMFS History: Patient Active Problem List   Diagnosis Date Noted  . S/P lumbar fusion 12/02/2018   Past Medical History:  Diagnosis Date  . Anxiety   . Arthritis   . COPD (chronic obstructive pulmonary disease) (HCC)   . Diabetes mellitus without complication (HCC)   . Dyspnea   . Hypertension   . Hypothyroidism   . RLS (restless legs syndrome)     History reviewed. No pertinent family history.  Past Surgical History:  Procedure Laterality Date  . ABDOMINAL HYSTERECTOMY    . BACK SURGERY    . CARPAL TUNNEL RELEASE    . HERNIA REPAIR    . MAXIMUM ACCESS (MAS) TRANSFORAMINAL LUMBAR INTERBODY FUSION (TLIF) 1 LEVEL Right 12/02/2018   Procedure: TRANSFORAMINAL LUMBAR INTERBODY FUSION - right - LUMBAR FOUR-LUMBAR FIVE - Interbody Fusion;  Surgeon: Jill Alert, MD;  [facility];  Service: Neurosurgery;  Laterality: Right;   Social History   Occupational History  . Not on file  Tobacco Use  . Smoking status: Current Every Day Smoker    Packs/day: 1.00    Years: 55.00    Pack years: 55.00  . Smokeless tobacco: Never Used  Substance and Sexual Activity  . Alcohol use: Not Currently  . Drug use: Not on file  . Sexual activity: Not on file

## 2020-07-24 NOTE — Progress Notes (Signed)
Subjective: Patient is here for ultrasound-guided intra-articular right hip injection.   She has chronic pain in the posterior hips, but prior SI joint injection per Dr. Murray Hodgkins did not provide relief.  X-rays show some hip DJD.  Objective: She has good range of motion but some pain with passive flexion and internal rotation.  She is also tender over the SI joint.  Procedure: Ultrasound-guided right hip injection: After sterile prep with Betadine, injected 8 cc 1% lidocaine without epinephrine and 40 mg methylprednisolone using a 22-gauge spinal needle, passing the needle through the iliofemoral ligament into the femoral head/neck junction.  Injectate was seen filling the joint capsule.  She did have an effusion visible on ultrasound.  She had good relief from the posterior pain during the anesthetic phase.  Follow-up as directed.

## 2020-07-25 ENCOUNTER — Telehealth: Payer: Self-pay | Admitting: Orthopaedic Surgery

## 2020-07-25 NOTE — Telephone Encounter (Signed)
Patient aware.

## 2020-07-25 NOTE — Telephone Encounter (Signed)
Patient called requesting a call back. Patient state she took prednisone and had a cortisone injection on yesterday and patient states her sugar is 283. Please call patient and advise medical advise. Patient phone number is [phone].

## 2020-07-25 NOTE — Telephone Encounter (Signed)
Need to call pcp for advice on what to do for sugar

## 2020-07-25 NOTE — Telephone Encounter (Signed)
See below

## 2020-12-19 ENCOUNTER — Other Ambulatory Visit: Payer: Self-pay | Admitting: Neurological Surgery

## 2020-12-19 DIAGNOSIS — M542 Cervicalgia: Secondary | ICD-10-CM

## 2021-01-03 ENCOUNTER — Ambulatory Visit
Admission: RE | Admit: 2021-01-03 | Discharge: 2021-01-03 | Disposition: A | Discharge status: Home / Self Care | Payer: Medicare HMO | Source: Ambulatory Visit | Attending: Neurological Surgery | Admitting: Neurological Surgery

## 2021-01-03 DIAGNOSIS — M542 Cervicalgia: Secondary | ICD-10-CM

## 2021-12-22 IMAGING — CT CT CERVICAL SPINE W/O CM
2 series · 10 of 14 positions shown, 12 images · non-contrast
Comparison: CT cervical spine dated July 10, 2020.

CLINICAL DATA: Chronic neck pain.  History of prior fusion.

EXAM:
CT CERVICAL SPINE WITHOUT CONTRAST
TECHNIQUE: Multidetector CT imaging of the cervical spine was performed without
intravenous contrast. Multiplanar CT image reconstructions were also
generated.

[Series 3: cspine soft · axial · 0.25mm/px · z∈[-255,-143]mm · 5 of 86 slices shown, 7 images]
[im 15/86  soft-tissue]
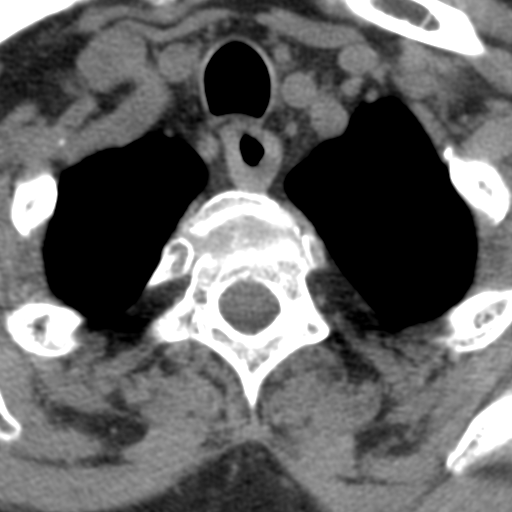
[im 15/86  bone]
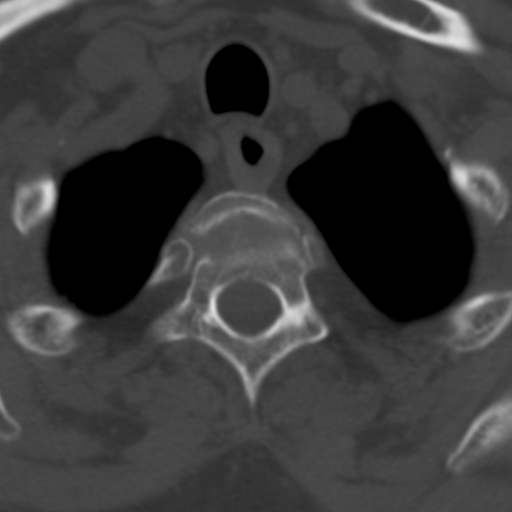
[im 29/86  bone]
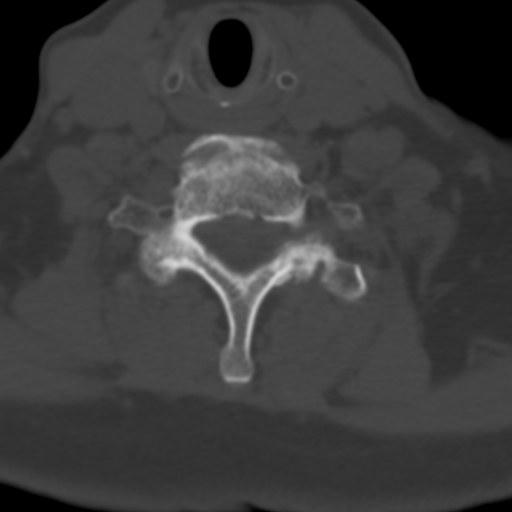
[im 43/86  bone]
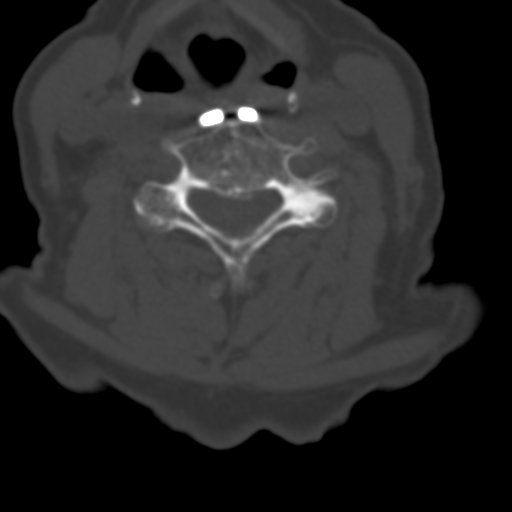
[im 57/86  bone]
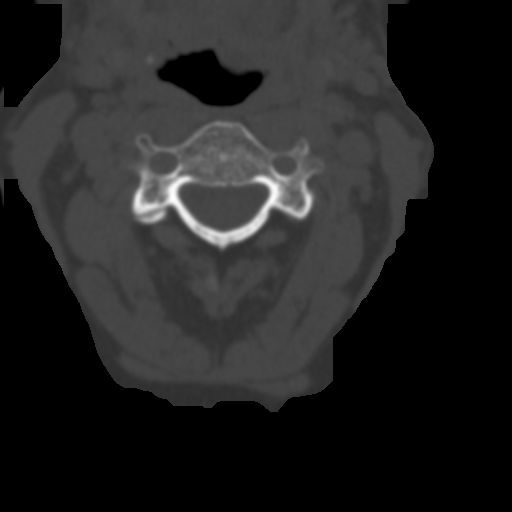
[im 71/86  soft-tissue]
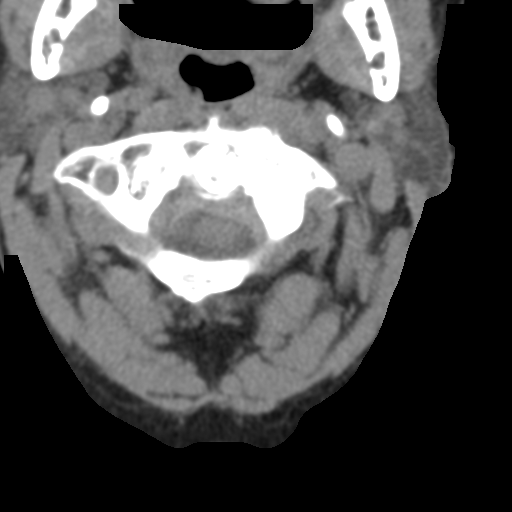
[im 71/86  bone]
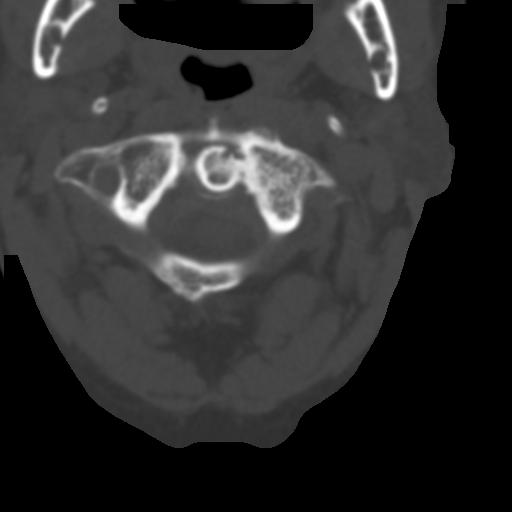

[Series 9: angled axial soft · axial · 0.27mm/px · z∈[-266,-151]mm · 5 of 89 slices shown]
[im 15/89  soft-tissue]
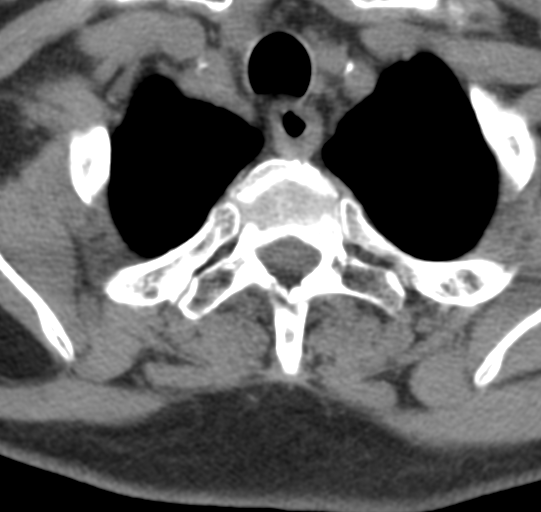
[im 30/89  soft-tissue]
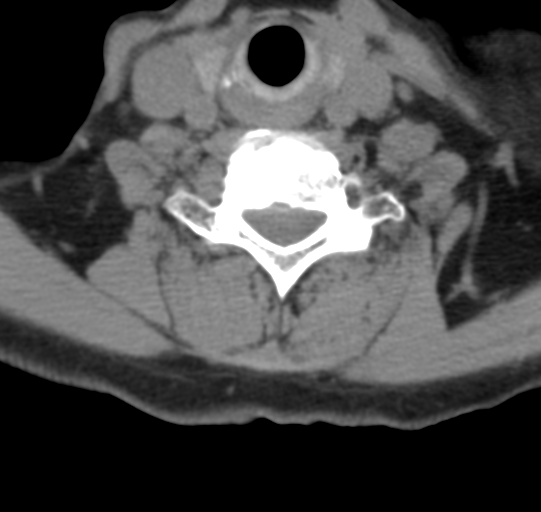
[im 45/89  soft-tissue]
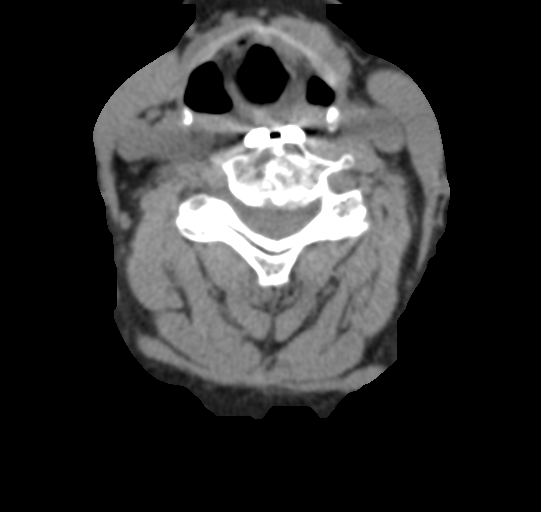
[im 59/89  soft-tissue]
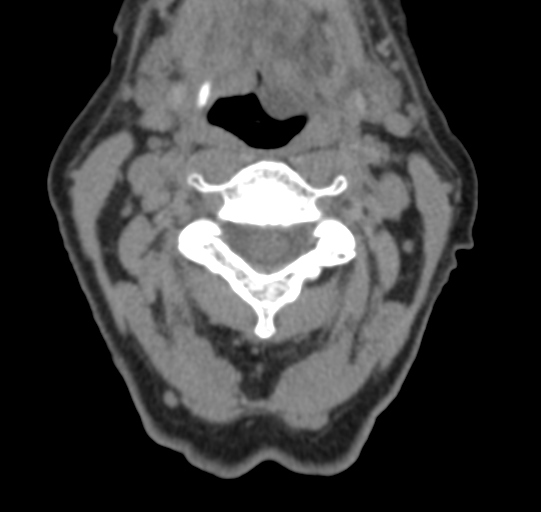
[im 74/89  soft-tissue]
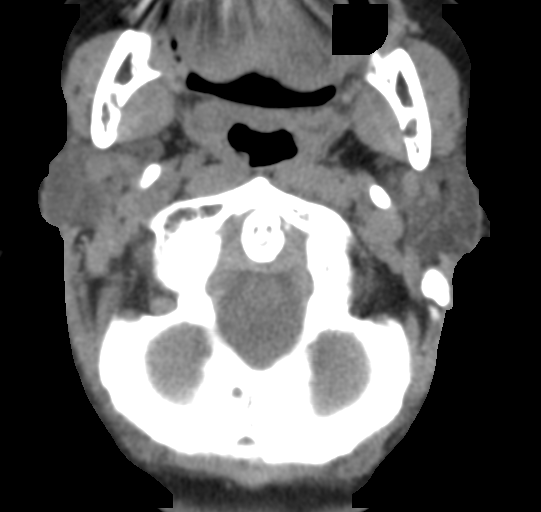

[10 of 14 positions shown; findings below may reference images not displayed]

FINDINGS: Alignment: Unchanged trace anterolisthesis at C7-T1.

Skull base and vertebrae: Prior C4-C5 ACDF with solid interbody
fusion. Unchanged severe left-sided C1-C2 osteoarthritis. No acute
fracture.

Soft tissues and spinal canal: No prevertebral fluid or swelling. No
visible canal hematoma.

Disc levels:

C2-C3:  Negative.

C3-C4:  Unchanged small central disc protrusion.  No stenosis.

C4-C5: Prior ACDF. Unchanged mild residual spinal canal stenosis due
to osseous ridging. No neuroforaminal stenosis.

C5-C6: Unchanged bulky posterior disc osteophyte complex with severe
left and moderate right uncovertebral hypertrophy. Unchanged
moderate spinal canal stenosis and left greater than right
neuroforaminal stenosis.

C6-C7: Unchanged small posterior disc osteophyte complex and
moderate bilateral uncovertebral hypertrophy. Unchanged mild spinal
canal and right neuroforaminal stenosis. No left neuroforaminal
stenosis.

C7-T1: Negative disc. Unchanged moderate bilateral facet
arthropathy. No stenosis.

Upper chest: Negative.

Other: Bilateral carotid artery calcific atherosclerosis.
IMPRESSION: 1. Prior C4-C5 ACDF with solid interbody fusion. Unchanged C5-C6
adjacent segment disease with moderate spinal canal stenosis and
left greater than right neuroforaminal stenosis.

## 2024-06-27 ENCOUNTER — Encounter (HOSPITAL_BASED_OUTPATIENT_CLINIC_OR_DEPARTMENT_OTHER): Payer: Self-pay

## 2024-06-27 ENCOUNTER — Ambulatory Visit (HOSPITAL_BASED_OUTPATIENT_CLINIC_OR_DEPARTMENT_OTHER): Admission: EM | Admit: 2024-06-27 | Discharge: 2024-06-27 | Disposition: A | Discharge status: Home / Self Care

## 2024-06-27 ENCOUNTER — Other Ambulatory Visit (HOSPITAL_BASED_OUTPATIENT_CLINIC_OR_DEPARTMENT_OTHER): Payer: Self-pay

## 2024-06-27 ENCOUNTER — Ambulatory Visit (HOSPITAL_BASED_OUTPATIENT_CLINIC_OR_DEPARTMENT_OTHER): Payer: Self-pay | Admitting: Family Medicine

## 2024-06-27 ENCOUNTER — Ambulatory Visit (HOSPITAL_BASED_OUTPATIENT_CLINIC_OR_DEPARTMENT_OTHER): Admit: 2024-06-27 | Discharge: 2024-06-27 | Disposition: A | Discharge status: Home / Self Care | Admitting: Radiology

## 2024-06-27 DIAGNOSIS — M79671 Pain in right foot: Secondary | ICD-10-CM

## 2024-06-27 DIAGNOSIS — S93601A Unspecified sprain of right foot, initial encounter: Secondary | ICD-10-CM | POA: Diagnosis not present

## 2024-06-27 DIAGNOSIS — S9031XA Contusion of right foot, initial encounter: Secondary | ICD-10-CM | POA: Diagnosis not present

## 2024-06-27 MED ORDER — MELOXICAM 7.5 MG PO TABS
7.5000 mg | ORAL_TABLET | Freq: Two times a day (BID) | ORAL | 0 refills | Status: AC | PRN
  Filled 2024-06-27: qty 14, 7d supply, fill #0

## 2024-06-27 NOTE — Discharge Instructions (Addendum)
 Right foot sprain with pain and contusion: Encouraged ice and elevation.  Reviewed kidney function and she has CKD stage IIIa.  Will use meloxicam , 7.5 mg, 1 pill twice daily if needed for pain.  Take the meloxicam  with food.  Only given 7 days of medication then needs to use acetaminophen .  Given an Ace bandage and a postop shoe.  Follow-up if symptoms do not improve, worsen or new symptoms occur.

## 2024-06-27 NOTE — Progress Notes (Signed)
 Film is negative for fractures nor dislocations.  It does show osteopenia.  Patient updated directly via phone call.  Follow-up as needed.

## 2024-06-27 NOTE — ED Triage Notes (Signed)
 Pt states yesterday she was walking in her kitchen when she turned her right foot over to the outside of her foot. Pt denies falling. Since then the outside of her foot aches constantly and when she walks on it it is a throbbing pain. Pt has taken tylenol  with some relief. She has bruising and some swelling.

## 2024-06-27 NOTE — ED Provider Notes (Signed)
 [facility]    CSN: [id number] Arrival date & time: 06/27/24  1041      History   Chief Complaint Chief Complaint  Patient presents with   Foot Pain    HPI Jill Friedman is a 78 y.o. female.   Patient was in her kitchen on 06/26/2024 and she caught her foot and it twisted and the right lateral foot is painful with bruising.  She did not fall but she is having significant throbbing pain at rest and she has pain with walking.  She is able to bear weight and walk on that foot.  Acetaminophen  has helped her pain some.   Foot Pain Pertinent negatives include no chest pain and no abdominal pain.    Past Medical History:  Diagnosis Date   Anxiety    Arthritis    COPD (chronic obstructive pulmonary disease) (HCC)    Diabetes mellitus without complication (HCC)    Dyspnea    Hypertension    Hypothyroidism    RLS (restless legs syndrome)     Patient Active Problem List   Diagnosis Date Noted   S/P lumbar fusion 12/02/2018    Past Surgical History:  Procedure Laterality Date   ABDOMINAL HYSTERECTOMY     BACK SURGERY     CARPAL TUNNEL RELEASE     HERNIA REPAIR     MAXIMUM ACCESS (MAS) TRANSFORAMINAL LUMBAR INTERBODY FUSION (TLIF) 1 LEVEL Right 12/02/2018   Procedure: TRANSFORAMINAL LUMBAR INTERBODY FUSION - right - LUMBAR FOUR-LUMBAR FIVE - Interbody Fusion;  Surgeon: Joshua Alm RAMAN, MD;  Location: [facility];  Service: Neurosurgery;  Laterality: Right;    OB History   No obstetric history on file.      Home Medications    Prior to Admission medications   Medication Sig Start Date End Date Taking? Authorizing Provider  albuterol (VENTOLIN HFA) 108 (90 Base) MCG/ACT inhaler Inhale 2 puffs into the lungs. 06/13/24  Yes [provider]  amLODipine (NORVASC) 10 MG tablet Take 10 mg by mouth daily. 07/15/21  Yes [provider]  betamethasone valerate (VALISONE) 0.1 % cream Apply topically. 03/23/24 09/19/24 Yes [provider]  Calcium  Carb-Cholecalciferol  (OYSTER SHELL CALCIUM W/D) 500-5 MG-MCG TABS Take 1 tablet by mouth. 07/11/20  Yes [provider]  ipratropium-albuterol (DUONEB) 0.5-2.5 (3) MG/3ML SOLN Inhale 3 mLs into the lungs. 03/23/24  Yes [provider]  meloxicam  (MOBIC ) 7.5 MG tablet Take 1 tablet (7.5 mg total) by mouth every 12 (twelve) hours as needed for up to 7 days for pain (Take with food.). 06/27/24 07/04/24 Yes Ival Domino, FNP  metoprolol succinate (TOPROL-XL) 50 MG 24 hr tablet Take 50 mg by mouth daily. 12/08/22  Yes [provider]  montelukast (SINGULAIR) 10 MG tablet Take 10 mg by mouth at bedtime. 06/03/24  Yes [provider]  olive oil external oil Apply topically. 12/23/22  Yes [provider]  pantoprazole (PROTONIX) 40 MG tablet Take 40 mg by mouth 2 (two) times daily before a meal. 12/13/20  Yes [provider]  pravastatin (PRAVACHOL) 20 MG tablet Take 20 mg by mouth daily. 03/24/19  Yes [provider]  temazepam (RESTORIL) 15 MG capsule Take 15 mg by mouth. 11/05/22 08/13/24 Yes [provider]  cetirizine (ZYRTEC) 10 MG tablet Take 10 mg by mouth daily.    [provider]  Cholecalciferol  (VITAMIN D -1000 MAX ST) 25 MCG (1000 UT) tablet Take 1,000 Units by mouth daily.    [provider]  glipiZIDE (GLUCOTROL XL) 10 MG 24 hr tablet Take 10 mg by mouth daily.    [provider]  JANUVIA 100 MG tablet Take 100 mg by mouth daily.    [provider]  levothyroxine  (SYNTHROID , LEVOTHROID) 75 MCG tablet Take 75 mcg by mouth daily before breakfast.    [provider]  Multiple Vitamins-Minerals (CENTRUM SILVER 50+WOMEN) TABS Take 1 tablet by mouth daily.    [provider]  vitamin B-12 (CYANOCOBALAMIN) 1000 MCG tablet Take 1,000 mcg by mouth daily.    [provider]    Family History History reviewed. No pertinent family history.  Social History Social History    Tobacco Use   Smoking status: Every Day    Current packs/day: 1.00    Average packs/day: 1 pack/day for 55.0 years (55.0 ttl pk-yrs)    Types: Cigarettes   Smokeless tobacco: Never  Vaping Use   Vaping status: Never Used  Substance Use Topics   Alcohol use: Not Currently   Drug use: Not Currently     Allergies   Codeine, Other, and Oxycontin [oxycodone hcl]   Review of Systems Review of Systems  Constitutional:  Negative for fever.  Respiratory:  Negative for cough.   Cardiovascular:  Negative for chest pain.  Gastrointestinal:  Negative for abdominal pain, constipation, diarrhea, nausea and vomiting.  Musculoskeletal:  Positive for joint swelling (Pain and swelling with bruising of right midfoot). Negative for arthralgias and back pain.  Skin:  Negative for color change and rash.  Neurological:  Negative for syncope.  All other systems reviewed and are negative.    Physical Exam Triage Vital Signs ED Triage Vitals [06/27/24 1057]  Encounter Vitals Group     BP      Girls Systolic BP Percentile      Girls Diastolic BP Percentile      Boys Systolic BP Percentile      Boys Diastolic BP Percentile      Pulse      Resp      Temp      Temp src      SpO2      Weight      Height      Head Circumference      Peak Flow      Pain Score 7     Pain Loc      Pain Education      Exclude from Growth Chart    No data found.  Updated Vital Signs BP 113/69 (BP Location: Right Arm)   Pulse (!) 55   Temp 98 F (36.7 C) (Oral)   Resp 20   SpO2 94%   Visual Acuity Right Eye Distance:   Left Eye Distance:   Bilateral Distance:    Right Eye Near:   Left Eye Near:    Bilateral Near:     Physical Exam Vitals and nursing note reviewed.  Constitutional:      General: She is not in acute distress.    Appearance: She is well-developed. She is not ill-appearing or toxic-appearing.  HENT:     Head: Normocephalic and atraumatic.     Right Ear: External ear normal.      Left Ear: External ear normal.     Nose: Nose normal.     Mouth/Throat:     Lips: Pink.     Mouth: Mucous membranes are moist.  Eyes:     Conjunctiva/sclera: Conjunctivae normal.     Pupils: Pupils are equal, round,  and reactive to light.  Cardiovascular:     Rate and Rhythm: Normal rate and regular rhythm.     Pulses:          Dorsalis pedis pulses are 2+ on the right side and 2+ on the left side.       Posterior tibial pulses are 2+ on the right side and 2+ on the left side.     Heart sounds: S1 normal and S2 normal. No murmur heard. Pulmonary:     Effort: Pulmonary effort is normal. No respiratory distress.     Breath sounds: Normal breath sounds. No decreased breath sounds, wheezing, rhonchi or rales.  Musculoskeletal:        General: No swelling.     Right knee: Normal.     Left knee: Normal.     Right lower leg: Normal.     Left lower leg: Normal.     Right ankle: Normal.     Left ankle: Normal.     Right foot: Decreased range of motion (Decreased supination and pronation due to pain). Normal capillary refill. Swelling (Right lateral midfoot with swelling and ecchymosis), tenderness (Right lateral midfoot) and bony tenderness (Right lateral midfoot) present. No deformity, bunion, Charcot foot, foot drop, prominent metatarsal heads, laceration or crepitus. Normal pulse.     Left foot: Normal.  Skin:    General: Skin is warm and dry.     Capillary Refill: Capillary refill takes less than 2 seconds.     Findings: No rash.  Neurological:     Mental Status: She is alert and oriented to person, place, and time.  Psychiatric:        Mood and Affect: Mood normal.      UC Treatments / Results  Labs (all labs ordered are listed, but only abnormal results are displayed) Comprehensive Metabolic Panel: 03/09/34: Order: [id number] Component Ref Range & Units 3 mo ago  Sodium 136 - 145 mmol/L 134 Low   Potassium 3.5 - 5.1 mmol/L 4.7  Comment: NO VISIBLE HEMOLYSIS   Chloride 98 - 107 mmol/L 96 Low   CO2 21 - 31 mmol/L 29  Anion Gap 6 - 14 mmol/L 9  Glucose, Random 70 - 99 mg/dL 741 High   Blood Urea Nitrogen (BUN) 7 - 25 mg/dL 18  Creatinine 9.39 - 8.79 mg/dL 8.84  eGFR >40 fO/fpw/8.26f7 49 Low   Comment: GFR estimated by CKD-EPI equations(NKF 2021).  Recommend confirmation of Cr-based eGFR by using Cys-based eGFR and other filtration markers (if applicable) in complex cases and clinical decision-making, as needed.  Albumin 3.5 - 5.7 g/dL 4.6  Total Protein 6.4 - 8.9 g/dL 7.1  Bilirubin, Total 0.3 - 1.0 mg/dL 1.0  Alkaline Phosphatase (ALP) 34 - 104 U/L 96  Aspartate Aminotransferase (AST) 13 - 39 U/L 18  Alanine Aminotransferase (ALT) 7 - 52 U/L 18  Calcium 8.6 - 10.3 mg/dL 9.7  BUN/Creatinine Ratio   Comment: Creatinine is normal, ratio is not clinically indicated.  Resulting Agency [facility](CLIA# [id number])    EKG   Radiology No results found.  Procedures Procedures (including critical care time)  Medications Ordered in UC Medications - No data to display  Initial Impression / Assessment and Plan / UC Course  I have reviewed the triage vital signs and the nursing notes.  Pertinent labs & imaging results that were available during my care of the patient were reviewed by me and considered in my medical decision making (see chart  for details).  Plan of Care: Right foot sprain with contusion and pain: X-ray appeared negative.  Will update the patient if the radiology review differs.  Encouraged ice and elevation.  Meloxicam  7.5 mg twice daily if needed for pain but limited to only 7 days due to CKD stage IIIa.  Not a candidate for steroids due to diabetes.  Provided an Ace bandage and postop shoe.  Follow-up if symptoms do not improve, worsen or new symptoms occur.  I reviewed the plan of care with the patient and/or the patient's guardian.  The patient and/or guardian had time to ask  questions and acknowledged that the questions were answered.  I provided instruction on symptoms or reasons to return here or to go to an ER, if symptoms/condition did not improve, worsened or if new symptoms occurred.  Final Clinical Impressions(s) / UC Diagnoses   Final diagnoses:  Right foot pain  Contusion of right foot, initial encounter  Sprain of right foot, initial encounter     Discharge Instructions      Right foot sprain with pain and contusion: Encouraged ice and elevation.  Reviewed kidney function and she has CKD stage IIIa.  Will use meloxicam , 7.5 mg, 1 pill twice daily if needed for pain.  Take the meloxicam  with food.  Only given 7 days of medication then needs to use acetaminophen .  Given an Ace bandage and a postop shoe.  Follow-up if symptoms do not improve, worsen or new symptoms occur.     ED Prescriptions     Medication Sig Dispense Auth. Provider   meloxicam  (MOBIC ) 7.5 MG tablet Take 1 tablet (7.5 mg total) by mouth every 12 (twelve) hours as needed for up to 7 days for pain (Take with food.). 14 tablet Altus Zaino, FNP      PDMP not reviewed this encounter.   Ival Domino, FNP 06/27/24 1156
